# Patient Record
Sex: Male | Born: 1995 | Race: White | Hispanic: No | Marital: Single | Smoking: Current every day smoker
Health system: Southern US, Community
[De-identification: ages and names within clinical notes are randomized; demographics above are authoritative.]

## PROBLEM LIST (undated history)

## (undated) DIAGNOSIS — R030 Elevated blood-pressure reading, without diagnosis of hypertension: Secondary | ICD-10-CM

## (undated) DIAGNOSIS — L858 Other specified epidermal thickening: Secondary | ICD-10-CM

## (undated) HISTORY — DX: Elevated blood-pressure reading, without diagnosis of hypertension: R03.0

## (undated) HISTORY — DX: Other specified epidermal thickening: L85.8

---
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
## 2015-01-12 ENCOUNTER — Encounter: Payer: Self-pay | Admitting: Family Medicine

## 2015-01-12 ENCOUNTER — Ambulatory Visit (INDEPENDENT_AMBULATORY_CARE_PROVIDER_SITE_OTHER): Payer: BLUE CROSS/BLUE SHIELD | Admitting: Family Medicine

## 2015-01-12 VITALS — BP 130/74 | HR 89 | Wt 201.0 lb

## 2015-01-12 DIAGNOSIS — F411 Generalized anxiety disorder: Secondary | ICD-10-CM | POA: Diagnosis not present

## 2015-01-12 DIAGNOSIS — F3160 Bipolar disorder, current episode mixed, unspecified: Secondary | ICD-10-CM

## 2015-01-12 MED ORDER — LAMOTRIGINE 100 MG PO TABS: ORAL_TABLET | ORAL | Status: DC

## 2015-01-12 MED ORDER — CLONAZEPAM 0.5 MG PO TABS: 0.2500 mg | ORAL_TABLET | Freq: Two times a day (BID) | ORAL | Status: DC | PRN

## 2015-01-12 MED ORDER — ARIPIPRAZOLE 15 MG PO TABS: 15.0000 mg | ORAL_TABLET | Freq: Every day | ORAL | Status: DC

## 2015-01-12 NOTE — Progress Notes (Signed)
CC: Wesley Gomez is a 19 y.o. male is here for f/u meds   Subjective: HPI:  Follow-up anxiety: He's been using hydroxyzine to address his daily anxiety. It's not been helpful whatsoever. He denies any known side effects from it. He still has daily anxiety described as worrying about "all sorts of things", it sounds like he gets anxious and overanalyzes everything and anything. He denies any particular reoccurring fear or nervousness. Symptoms can occur anytime of the day and are moderate in severity. He has not found anything that helps this. He denies any paranoia or hallucinations.  Follow bipolar disorder: He states that he still believes that Abilify and Lamictal are doing a great job with controlling his depression. He tells me he's happy with life other than the annoyance of daily anxiety. He denies thoughts 1 to harm self or others. No unintentional weight gain or loss. Denies any known side effects.   Review Of Systems Outlined In HPI  Past Medical History  Diagnosis Date  . 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 Elevated BP 08/19/2012    No past surgical history on file. No family history on file.  Social History   Social History  . Marital Status: Single    Spouse Name: N/A  . Number of Children: N/A  . Years of Education: N/A   Occupational History  . Not on file.   Social History Main Topics  . Smoking status: Never Smoker   . Smokeless tobacco: Not on file  . Alcohol Use: No  . Drug Use: No  . Sexual Activity: Not on file   Other Topics Concern  . Not on file   Social History Narrative     Objective: BP 130/74 mmHg  Pulse 89  Wt 201 lb (91.173 kg)  Vital signs reviewed. General: Alert and Oriented, No Acute Distress HEENT: Pupils equal, round, reactive to light. Conjunctivae clear.  External ears unremarkable.  Moist mucous membranes. Lungs: Clear and comfortable work of breathing, speaking in full sentences without accessory muscle use. Cardiac: Regular rate and  rhythm.  Neuro: CN II-XII grossly intact, gait normal. Extremities: No peripheral edema.  Strong peripheral pulses.  Mental Status: No depression, anxiety, nor agitation. Logical though process. Skin: Warm and dry.  Assessment & Plan: Adoni was seen today for f/u meds.  Diagnoses and all orders for this visit:  Bipolar affective disorder, most recent episode mixed, remission status unspecified -     lamoTRIgine (LAMICTAL) 100 MG tablet; One by mouth daily.  Anxiety state -     clonazePAM (KLONOPIN) 0.5 MG tablet; Take 0.5-1 tablets (0.25-0.5 mg total) by mouth 2 (two) times daily as needed for anxiety.  Generalized anxiety disorder  Other orders -     ARIPiprazole (ABILIFY) 15 MG tablet; Take 1 tablet (15 mg total) by mouth daily.   Anxiety: Uncontrolled chronic condition start as needed clonazepam, discuss its best to use this only if absolutely necessary to help prevent tolerance Bipolar disorder: Controlled continue Abilify and Lamictal.   Return in about 3 months (around 04/14/2015).

## 2015-01-18 ENCOUNTER — Ambulatory Visit (INDEPENDENT_AMBULATORY_CARE_PROVIDER_SITE_OTHER): Payer: BLUE CROSS/BLUE SHIELD | Admitting: Family Medicine

## 2015-01-18 ENCOUNTER — Encounter: Payer: Self-pay | Admitting: Family Medicine

## 2015-01-18 VITALS — BP 132/80 | HR 93 | Wt 192.0 lb

## 2015-01-18 DIAGNOSIS — F3160 Bipolar disorder, current episode mixed, unspecified: Secondary | ICD-10-CM | POA: Diagnosis not present

## 2015-01-18 DIAGNOSIS — F411 Generalized anxiety disorder: Secondary | ICD-10-CM | POA: Diagnosis not present

## 2015-01-18 MED ORDER — ALPRAZOLAM 1 MG PO TABS: 1.0000 mg | ORAL_TABLET | Freq: Every day | ORAL | Status: DC | PRN

## 2015-01-18 MED ORDER — ARIPIPRAZOLE 30 MG PO TABS: 30.0000 mg | ORAL_TABLET | Freq: Every day | ORAL | Status: DC

## 2015-01-18 NOTE — Progress Notes (Signed)
CC: Wesley Gomez is a 19 y.o. male is here for clonazepam not helping   Subjective: HPI:   Follow-up anxiety: He tells me he could barely even feel that he was taking any medication when he was taking as needed clonazepam. It did not seem to help with anxiety whatsoever. He continues to have a moderate degree of daily worrying about anything and everything. He denies any paranoia or suspicions. Symptoms are present all hours of day and occasionally interfere with sleep.  He feels that his depression might be starting to get worse. He's losing interest in formerly pleasurable activities. He is afraid that he might be slipping back into depression but denies any other specific symptoms. Denies unintentional weight loss nor weight gain. Denies appetite change. Denies fevers, chills nor any feeling of being unwell other than that described above.   Review Of Systems Outlined In HPI  Past Medical History  Diagnosis Date  . 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 Elevated BP 08/19/2012    No past surgical history on file. No family history on file.  Social History   Social History  . Marital Status: Single    Spouse Name: N/A  . Number of Children: N/A  . Years of Education: N/A   Occupational History  . Not on file.   Social History Main Topics  . Smoking status: Never Smoker   . Smokeless tobacco: Not on file  . Alcohol Use: No  . Drug Use: No  . Sexual Activity: Not on file   Other Topics Concern  . Not on file   Social History Narrative     Objective: BP 132/80 mmHg  Pulse 93  Wt 192 lb (87.091 kg)  Vital signs reviewed. General: Alert and Oriented, No Acute Distress HEENT: Pupils equal, round, reactive to light. Conjunctivae clear.  External ears unremarkable.  Moist mucous membranes. Lungs: Clear and comfortable work of breathing, speaking in full sentences without accessory muscle use. Cardiac: Regular rate and rhythm.  Neuro: CN II-XII grossly intact, gait  normal. Extremities: No peripheral edema.  Strong peripheral pulses.  Mental Status: No depression, anxiety, nor agitation. Logical though process. Skin: Warm and dry.  Assessment & Plan: Wesley Gomez was seen today for clonazepam not helping.  Diagnoses and all orders for this visit:  Anxiety state -     ARIPiprazole (ABILIFY) 30 MG tablet; Take 1 tablet (30 mg total) by mouth daily. -     ALPRAZolam (XANAX) 1 MG tablet; Take 1 tablet (1 mg total) by mouth daily as needed for anxiety.  Bipolar affective disorder, most recent episode mixed, remission status unspecified   Anxiety: Uncontrolled, he is already experiencing some decreased libido due to his anxiety therefore joint decision to avoid Lexapro at this time and instead start Xanax. Bipolar disorder: Possibly worsening therefore increasing Abilify.  25 minutes spent face-to-face during visit today of which at least 50% was counseling or coordinating care regarding: 1. Anxiety state   2. Bipolar affective disorder, most recent episode mixed, remission status unspecified      Return in about 3 months (around 04/20/2015).

## 2015-01-25 ENCOUNTER — Encounter: Payer: Self-pay | Admitting: Family Medicine

## 2015-01-25 ENCOUNTER — Ambulatory Visit (INDEPENDENT_AMBULATORY_CARE_PROVIDER_SITE_OTHER): Payer: BLUE CROSS/BLUE SHIELD | Admitting: Family Medicine

## 2015-01-25 VITALS — BP 135/83 | HR 98 | Wt 196.0 lb

## 2015-01-25 DIAGNOSIS — R41840 Attention and concentration deficit: Secondary | ICD-10-CM

## 2015-01-25 DIAGNOSIS — F3160 Bipolar disorder, current episode mixed, unspecified: Secondary | ICD-10-CM

## 2015-01-25 MED ORDER — ARIPIPRAZOLE 15 MG PO TABS: 15.0000 mg | ORAL_TABLET | Freq: Every day | ORAL | Status: DC

## 2015-01-25 MED ORDER — LAMOTRIGINE 200 MG PO TABS: 200.0000 mg | ORAL_TABLET | Freq: Every day | ORAL | Status: DC

## 2015-01-25 NOTE — Progress Notes (Signed)
CC: Wesley Gomez is a 19 y.o. male is here for referral for ADHD and Personal Problem   Subjective: HPI:  Follow-up bipolar: Since going up on Abilify he's noticed it is much more restless and fidgety. He doesn't feel that depression has gotten a bit better since he went up on this medication. He's done some research online and says that he expresses symptoms of inattentiveness, distractibility, restlessness, and forgetfulness to a severe degree that was even present prior to going on Abilify or Lamictal. He wants to know if she might have ADD. No thoughts of wanting to harm self or others.  Anxiety seems to not use has been issue to him since I saw him last.   Review Of Systems Outlined In HPI  Past Medical History  Diagnosis Date  . 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 Elevated BP 08/19/2012    No past surgical history on file. No family history on file.  Social History   Social History  . Marital Status: Single    Spouse Name: N/A  . Number of Children: N/A  . Years of Education: N/A   Occupational History  . Not on file.   Social History Main Topics  . Smoking status: Never Smoker   . Smokeless tobacco: Not on file  . Alcohol Use: No  . Drug Use: No  . Sexual Activity: Not on file   Other Topics Concern  . Not on file   Social History Narrative     Objective: BP 135/83 mmHg  Pulse 98  Wt 196 lb (88.905 kg)  Vital signs reviewed. General: Alert and Oriented, No Acute Distress HEENT: Pupils equal, round, reactive to light. Conjunctivae clear.  External ears unremarkable.  Moist mucous membranes. Lungs: Clear and comfortable work of breathing, speaking in full sentences without accessory muscle use. Cardiac: Regular rate and rhythm.  Neuro: CN II-XII grossly intact, gait normal. Extremities: No peripheral edema.  Strong peripheral pulses.  Mental Status: No depression, anxiety, nor agitation. Logical though process. Skin: Warm and dry. Assessment & Plan: [place] was  seen today for referral for adhd and personal problem.  Diagnoses and all orders for this visit:  Poor concentration -     Ambulatory referral to Psychology  Bipolar affective disorder, most recent episode mixed, remission status unspecified  Other orders -     ARIPiprazole (ABILIFY) 15 MG tablet; Take 1 tablet (15 mg total) by mouth daily. -     lamoTRIgine (LAMICTAL) 200 MG tablet; Take 1 tablet (200 mg total) by mouth daily.   Bipolar disorder: Uncontrolled chronic condition decreasing Abilify due to side effects, now increasing Lamictal to 200 mg. Poor concentration: There's a good likelihood that he did have comorbid ADHD along with bipolar disorder. Referral to Dr. Johnnye Sima for ADHD testing.  Return if symptoms worsen or fail to improve.

## 2015-02-20 ENCOUNTER — Other Ambulatory Visit: Payer: Self-pay | Admitting: Family Medicine

## 2015-02-23 ENCOUNTER — Other Ambulatory Visit: Payer: Self-pay | Admitting: Family Medicine

## 2015-02-27 ENCOUNTER — Other Ambulatory Visit: Payer: Self-pay | Admitting: Family Medicine

## 2015-03-08 ENCOUNTER — Ambulatory Visit (INDEPENDENT_AMBULATORY_CARE_PROVIDER_SITE_OTHER): Payer: BLUE CROSS/BLUE SHIELD | Admitting: Sports Medicine

## 2015-03-08 ENCOUNTER — Encounter: Payer: Self-pay | Admitting: Sports Medicine

## 2015-03-08 DIAGNOSIS — F3132 Bipolar disorder, current episode depressed, moderate: Secondary | ICD-10-CM

## 2015-03-08 MED ORDER — ARIPIPRAZOLE 20 MG PO TABS: 20.0000 mg | ORAL_TABLET | Freq: Every day | ORAL | Status: DC

## 2015-03-08 NOTE — Assessment & Plan Note (Addendum)
Unfortunately in depressive phase right now. Previously bumping up from 15-30 mg of Abilify has created akathisia. Going to simply increase to 20 mg of Abilify, and continue Lamictal at the current dose. I'm also trying get him into psychiatry downstairs. Return in one to see PCP I was able to get him in on Friday with psychiatry downstairs to establish, phew.

## 2015-03-08 NOTE — Progress Notes (Signed)
  Subjective:    CC: follow-up  HPI: Bipolar disorder, depressed phase: Wesley Gomez is a pleasant 19 year old male with bipolar disorder, depressive phase, he has been well-controlled on 200 mg of Lamictal and 15 mg of Abilify, and a previous depressed phase he was increased to 30 mg of Abilify which seemed simply to cause increasing akathisia. Has not yet seen his psychiatrist. He does see a behavioral therapist. Currently he is feeling symptoms of depression with moderate anhedonia, difficulty sleeping, poor energy, changes in appetite, guilt, and mild depressed mood, difficult to concentrating, psychomotor retardation, he denies any suicidal or homicidal ideation. He tells me his last manic phase was several weeks ago, he is able to recognize it, and he felt decreased need for sleep, and a somewhat elated mood.  Past medical history, Surgical history, Family history not pertinant except as noted below, Social history, Allergies, and medications have been entered into the medical record, reviewed, and no changes needed.   Review of Systems: No fevers, chills, night sweats, weight loss, chest pain, or shortness of breath.   Objective:    General: Well Developed, well nourished, and in no acute distress.  Neuro: Alert and oriented x3, extra-ocular muscles intact, sensation grossly intact.  HEENT: Normocephalic, atraumatic, pupils equal round reactive to light, neck supple, no masses, no lymphadenopathy, thyroid nonpalpable.  Skin: Warm and dry, no rashes. Cardiac: Regular rate and rhythm, no murmurs rubs or gallops, no lower extremity edema.  Respiratory: Clear to auscultation bilaterally. Not using accessory muscles, speaking in full sentences.  Impression and Recommendations:   I spent 25 minutes with this patient, greater than 50% was face-to-face time counseling regarding the above diagnoses

## 2015-03-12 ENCOUNTER — Ambulatory Visit (INDEPENDENT_AMBULATORY_CARE_PROVIDER_SITE_OTHER): Payer: Commercial Indemnity | Admitting: Psychiatry

## 2015-03-12 ENCOUNTER — Encounter (HOSPITAL_COMMUNITY): Payer: Self-pay | Admitting: Psychiatry

## 2015-03-12 VITALS — HR 82 | Ht 69.0 in | Wt 195.0 lb

## 2015-03-12 DIAGNOSIS — F3130 Bipolar disorder, current episode depressed, mild or moderate severity, unspecified: Secondary | ICD-10-CM

## 2015-03-12 DIAGNOSIS — F411 Generalized anxiety disorder: Secondary | ICD-10-CM

## 2015-03-12 DIAGNOSIS — F1210 Cannabis abuse, uncomplicated: Secondary | ICD-10-CM | POA: Diagnosis not present

## 2015-03-12 DIAGNOSIS — F3132 Bipolar disorder, current episode depressed, moderate: Secondary | ICD-10-CM

## 2015-03-12 DIAGNOSIS — F1290 Cannabis use, unspecified, uncomplicated: Secondary | ICD-10-CM

## 2015-03-12 MED ORDER — ARIPIPRAZOLE 15 MG PO TABS: 15.0000 mg | ORAL_TABLET | Freq: Every day | ORAL | Status: DC

## 2015-03-12 MED ORDER — BUPROPION HCL 100 MG PO TABS: 100.0000 mg | ORAL_TABLET | Freq: Two times a day (BID) | ORAL | Status: DC

## 2015-03-12 NOTE — Progress Notes (Signed)
Psychiatric Initial Adult Assessment   Patient Identification: Wesley Gomez MRN:  [id number] Date of Evaluation:  03/12/2015 Referral Source: Dr. Ileene Gomez and Dr. Darene Gomez Chief Complaint:   [name]; Depression     Visit Diagnosis:    ICD-9-CM ICD-10-CM   1. Bipolar I disorder, most recent episode depressed ([place]) 296.50 F31.30   2. GAD (generalized anxiety disorder) 300.02 F41.1   3. Bipolar affective disorder, currently depressed, moderate ([facility]) 296.52 F31.32 ARIPiprazole (ABILIFY) 15 MG tablet  4. Marijuana use 305.20 F12.10    Diagnosis:   Patient Active Problem List   Diagnosis Date Noted  . Generalized anxiety disorder [F41.1] 01/12/2015  . Bipolar disorder ([place]) [F31.9] 11/16/2014  . 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 Elevated BP [R03.0] 08/19/2012  . Acne [L70.9] 08/19/2012   History of Present Illness:  19 years old currently single [place] was living with his parents. Referred by his primary care Dr. Ileene Gomez and Dr. Darene Gomez. has been diagnosed with bipolar currently on Abilify and Lamictal. Because of EPS is Abilify was cut down to 20 mg prior to referral. He has noticed decrease in the tremors since cut down of the dose.  Patient gives a history of having episodes of depression and episodes of hyper less. Elevated mood episodes include elevated mood with increased energy and distractibility with decreased need for sleep getting involved in spending money. Says that they last maybe a week or more. He has been on Lamictal and Abilify by his primary care physician have never seen a psychiatrist before have seen a counselor in the past. Patient also endorses episodes of depression which it includes decreased energy, anhedonia, decreased interest decreased motivation tiredness. Feeling of helplessness or down mood but not hopelessness or suicidal thoughts. Patient also endorses using marijuana for the last 2 years once a week or often more than that. Last  use was 2 weeks ago according to him.  Patient left school in ninth grade city was anxious in school is suffered from anxiety was not able to cope with school, anxiety and stress.  Patient also endorses excessive worries, unreasonable at times. It affects his sleep and also sometimes has panic attacks. He is on Xanax 1 mg a day. Says that this has started for the last 2 months he understands that it would be for short term.  Aggravating factors; there is no clear aggravating factor for depression. Says that he does get stressed out her anxiety may be triggered depression Modifying factors; his girlfriend, supportive parents, music   Elements:  Location:  depression, anxiety. Quality:  moderate. Severity:  5/10. . Associated Signs/Symptoms: Depression Symptoms:  anhedonia, difficulty concentrating, anxiety, loss of energy/fatigue, disturbed sleep, (Hypo) Manic Symptoms:  Distractibility, Labiality of Mood, Anxiety Symptoms:  Excessive Worry, Psychotic Symptoms:  denies PTSD Symptoms: NA  Past Medical History:  Past Medical History  Diagnosis Date  . 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 Elevated BP 08/19/2012   History reviewed. No pertinent past surgical history. Family History:  Family History  Problem Relation Age of Onset  . Depression Father   . Depression Maternal Grandmother    Social History:   Social History   Social History  . Marital Status: Single    Spouse Name: N/A  . Number of Children: N/A  . Years of Education: N/A   Social History Main Topics  . Smoking status: Never Smoker   . Smokeless tobacco: None  . Alcohol Use: No  . Drug Use: No  .  Sexual Activity: Yes   Other Topics Concern  . None   Social History Narrative   Additional Social History: Patient grew up with his parents. And has one brother and one sister. He left school in ninth grade says that he was having anxiety in school and did not want to continue. He is transferred to finish his GED.  Currently patient is not working. He is living with his parents   Musculoskeletal: Strength & Muscle Tone: within normal limits Gait & Station: normal Patient leans: Backward  Psychiatric Specialty Exam: HPI  Review of Systems  Constitutional: Negative.   Cardiovascular: Negative for chest pain.  Skin: Negative for rash.  Neurological: Negative for headaches.  Psychiatric/Behavioral: Positive for depression. Negative for suicidal ideas and hallucinations.    Pulse 82, height 5\' 9"  (1.753 m), weight 195 lb (88.451 kg).Body mass index is 28.78 kg/(m^2).  General Appearance: Casual  Eye Contact:  Fair  Speech:  Slow  Volume:  Decreased  Mood:  Dysphoric  Affect:  Constricted  Thought Process:  Coherent  Orientation:  Full (Time, Place, and Person)  Thought Content:  Rumination  Suicidal Thoughts:  No  Homicidal Thoughts:  No  Memory:  Immediate;   Fair Recent;   Fair  Judgement:  Fair  Insight:  Shallow  Psychomotor Activity:  Decreased  Concentration:  Fair  Recall:  [employer] of Knowledge:Fair  Language: Fair  Akathisia:  mild  Handed:  Right  AIMS (if indicated):  0  Assets:  Desire for Improvement Physical Health Social Support  ADL's:  Intact  Cognition: WNL  Sleep:  fair   Is the patient at risk to self?  No. Has the patient been a risk to self in the past 6 months?  No. Has the patient been a risk to self within the distant past?  No. Is the patient a risk to others?  No. Has the patient been a risk to others in the past 6 months?  No. Has the patient been a risk to others within the distant past?  No.  Allergies:  No Known Allergies Current Medications: Current Outpatient Prescriptions  Medication Sig Dispense Refill  . Adapalene-Benzoyl Peroxide (EPIDUO) 0.1-2.5 % gel Apply 1 application topically at bedtime. Patient needs to schedule a follow up appointment before more refills. 45 g 0  . ALPRAZolam (XANAX) 1 MG tablet TAKE 1 TABLET BY MOUTH EVERY  DAY 30 tablet 0  . ARIPiprazole (ABILIFY) 15 MG tablet Take 1 tablet (15 mg total) by mouth daily. Patient has 30mg  tablets . Will take half of it. 30 tablet 0  . buPROPion (WELLBUTRIN) 100 MG tablet Take 1 tablet (100 mg total) by mouth 2 (two) times daily. 30 tablet 0  . lamoTRIgine (LAMICTAL) 200 MG tablet Take 1 tablet (200 mg total) by mouth daily. 30 tablet 2   No current facility-administered medications for this visit.    Previous Psychotropic Medications: No   Substance Abuse History in the last 12 months:  Yes.    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 Says it helps his anxiety,  Medical Decision Making:  Review of Psycho-Social Stressors (1), Decision to obtain old records (1), Order AIMS Test (2), Review of Medication Regimen & Side Effects (2) and Review of New Medication or Change in Dosage (2)  Treatment Plan Summary: Medication management and Plan as follows  Bipolar disorder,  depressed: He will continue Lamictal. There is no rash Lower Abilify to 15 mg he does have a 30 minute will start using half of it. This is to cut down further on his tremors. Add Wellbutrin 100 mg for depression and keep the dose low since he has bipolar. Reviewed side effects GAD: He is currently on Xanax 1 mg a day cautioned not to use it for long-term treatment discussed option of changing it to BuSpar or an SSRI . He gets refill from Dr. Ileene Gomez. Marijuana use: Cautioned that to abstain it and it can affect his judgment and cause more depression he remains reluctant and shows poor insight about its effect on his condition. Labs reviewed and are done by primary care Recommended psychotherapy to evaluate further on anxiety and depression but he does not give a clear trigger factor for depression. Patient is a non nicotine smoker More than 50% spent in counseling and coordination care  including patient education Call 911 or report local emergency room for any urgent concerns of suicidal thoughts Follow-up in 3-4 weeks or earlier if needed    Alim Cattell 10/14/201612:04 PM

## 2015-03-12 NOTE — Patient Instructions (Signed)
Cut down dose of abilify to 15mg . He has 30mg  tablets . Will use half tablets Start wellbutrin 100mg  Abstain from [facility]

## 2015-03-15 ENCOUNTER — Ambulatory Visit (INDEPENDENT_AMBULATORY_CARE_PROVIDER_SITE_OTHER): Payer: BLUE CROSS/BLUE SHIELD | Admitting: Family Medicine

## 2015-03-15 ENCOUNTER — Encounter: Payer: Self-pay | Admitting: Family Medicine

## 2015-03-15 VITALS — BP 133/80 | HR 90 | Wt 199.0 lb

## 2015-03-15 DIAGNOSIS — F3132 Bipolar disorder, current episode depressed, moderate: Secondary | ICD-10-CM

## 2015-03-15 MED ORDER — LAMOTRIGINE 200 MG PO TABS: 200.0000 mg | ORAL_TABLET | Freq: Every day | ORAL | Status: DC

## 2015-03-15 NOTE — Progress Notes (Signed)
CC: Wesley Gomez is a 19 y.o. male is here for Depression   Subjective: HPI:   follow-up bipolar disorder: Since I saw him last he is started on Wellbutrin. He tells me that he feels like it's working with respect to depressive symptoms. He denies any known side effects from this medication. Since Abilify was decreased he's no longer had any shaking of the hands. He denies any trauma elsewhere. He tells me he is a little bit anxious about nothing in particular and it's mildly interfering with quality of life. He is not certain if it's gotten better or worse and started on Wellbutrin. He denies any thoughts of wanting to harm himself or others.  He denies any manic episodes or difficulty sleeping.   Review Of Systems Outlined In HPI  Past Medical History  Diagnosis Date  . 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 Elevated BP 08/19/2012    No past surgical history on file. Family History  Problem Relation Age of Onset  . Depression Father   . Depression Maternal Grandmother     Social History   Social History  . Marital Status: Single    Spouse Name: N/A  . Number of Children: N/A  . Years of Education: N/A   Occupational History  . Not on file.   Social History Main Topics  . Smoking status: Never Smoker   . Smokeless tobacco: Not on file  . Alcohol Use: No  . Drug Use: No  . Sexual Activity: Yes   Other Topics Concern  . Not on file   Social History Narrative     Objective: BP 133/80 mmHg  Pulse 90  Wt 199 lb (90.266 kg)  Vital signs reviewed. General: Alert and Oriented, No Acute Distress HEENT: Pupils equal, round, reactive to light. Conjunctivae clear.  External ears unremarkable.  Moist mucous membranes. Lungs: Clear and comfortable work of breathing, speaking in full sentences without accessory muscle use. Cardiac: Regular rate and rhythm.  Neuro: CN II-XII grossly intact, gait normal. Extremities: No peripheral edema.  Strong peripheral pulses.  Mental Status: No  depression, anxiety, nor agitation. Logical though process. Skin: Warm and dry.  Assessment & Plan: Wesley Gomez was seen today for depression.  Diagnoses and all orders for this visit:  Bipolar affective disorder, currently depressed, moderate ([place])  Other orders -     lamoTRIgine (LAMICTAL) 200 MG tablet; Take 1 tablet (200 mg total) by mouth daily.   Bipolar disorder: Currently controlled with Lamictal, Abilify, Wellbutrin. Recommended that he follow-up with psychiatry in the middle of November. Discussed that if anxiety does not improve by the beginning of next week please notify me and we'll offer alternatives to Xanax but will continue bupropion.  Return if symptoms worsen or fail to improve.

## 2015-04-02 ENCOUNTER — Encounter (HOSPITAL_COMMUNITY): Payer: Self-pay | Admitting: Psychiatry

## 2015-04-02 ENCOUNTER — Ambulatory Visit (INDEPENDENT_AMBULATORY_CARE_PROVIDER_SITE_OTHER): Payer: Commercial Indemnity | Admitting: Psychiatry

## 2015-04-02 ENCOUNTER — Ambulatory Visit: Payer: BLUE CROSS/BLUE SHIELD | Admitting: Family Medicine

## 2015-04-02 VITALS — BP 128/70 | HR 98 | Ht 70.0 in | Wt 205.0 lb

## 2015-04-02 DIAGNOSIS — F3132 Bipolar disorder, current episode depressed, moderate: Secondary | ICD-10-CM

## 2015-04-02 MED ORDER — BUPROPION HCL 100 MG PO TABS: 200.0000 mg | ORAL_TABLET | Freq: Two times a day (BID) | ORAL | Status: DC

## 2015-04-02 MED ORDER — ARIPIPRAZOLE 10 MG PO TABS: 10.0000 mg | ORAL_TABLET | Freq: Every day | ORAL | Status: DC

## 2015-04-02 NOTE — Progress Notes (Signed)
Patient ID: Wesley Gomez, male   DOB: 1995/06/01, 19 y.o.   MRN: [id number]  Psychiatric Outpatient Follow up visit  Patient Identification: Wesley Gomez MRN:  [id number] Date of Evaluation:  04/02/2015 Referral Source: Dr. Ileene Rubens and Dr. Darene Lamer Chief Complaint:   Chief Complaint    Follow-up     Visit Diagnosis:    ICD-9-CM ICD-10-CM   1. Bipolar affective disorder, currently depressed, moderate (HCC) 296.52 F31.32 ARIPiprazole (ABILIFY) 10 MG tablet   Diagnosis:   Patient Active Problem List   Diagnosis Date Noted  . Generalized anxiety disorder [F41.1] 01/12/2015  . Bipolar disorder ([place]) [F31.9] 11/16/2014  . 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 Elevated BP [R03.0] 08/19/2012  . Acne [L70.9] 08/19/2012   History of Present Illness:  19 years old currently single white male living with his parents. Referred initially by his primary care Dr. Ileene Rubens and Dr. Darene Lamer. has been diagnosed with bipolar currently on Abilify and Lamictal. Because of EPS is Abilify was cut down to 20 mg prior to referral. He has noticed decrease in the tremors since cut down of the dose.  Last visit to cut down Abilify from 20-15 mg. There is some decrease in tremors. He still remains somewhat down Wellbutrin was added but he did not increase to 200 mg. States that he struggled for a job he does have the work permit and that'll help once he starts working. There is no worsening of depression since he cut down the Abilify  Patient left school in ninth grade city was anxious in school is suffered from anxiety was not able to cope with school, anxiety and stress.  Patient still endorses anxiety excessive worries at times. He is not taking Xanax anymore. He continues to chemical formal symptoms of dose of 200 mg is no rash reported  Aggravating factors; there is no clear aggravating factor for depression. Says that he does get stressed out her anxiety may be triggered depression Modifying factors; his girlfriend,  supportive parents, music   Elements:   Location: depression, anxiety, tremors severity: 6/10. 10 being no depression Quality: mild to moderate.  Associated Signs/Symptoms:  (Hypo) Manic Symptoms:  distractability Anxiety Symptoms:  Excessive Worry, Psychotic Symptoms:  denies PTSD Symptoms: NA  Past Medical History:  Past Medical History  Diagnosis Date  . 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 Elevated BP 08/19/2012   History reviewed. No pertinent past surgical history. Family History:  Family History  Problem Relation Age of Onset  . Depression Father   . Depression Maternal Grandmother    Social History:   Social History   Social History  . Marital Status: Single    Spouse Name: N/A  . Number of Children: N/A  . Years of Education: N/A   Social History Main Topics  . Smoking status: Never Smoker   . Smokeless tobacco: None  . Alcohol Use: No  . Drug Use: No  . Sexual Activity: Yes   Other Topics Concern  . None   Social History Narrative      Musculoskeletal: Strength & Muscle Tone: within normal limits Gait & Station: normal Patient leans: Backward  Psychiatric Specialty Exam: HPI  Review of Systems  Constitutional: Negative.   Cardiovascular: Negative for chest pain.  Skin: Negative for rash.  Neurological: Positive for tremors. Negative for headaches.  Psychiatric/Behavioral: Positive for depression. Negative for suicidal ideas and hallucinations.    Blood pressure 128/70, pulse 98, height 5\' 10"  (1.778 m), weight 205 lb (92.987 kg), SpO2 98 %.  Body mass index is 29.41 kg/(m^2).  General Appearance: Casual  Eye Contact:  Fair  Speech:  Slow  Volume:  Decreased  Mood:  Dysphoric but less  Affect:  Constricted  Thought Process:  Coherent  Orientation:  Full (Time, Place, and Person)  Thought Content:  Rumination  Suicidal Thoughts:  No  Homicidal Thoughts:  No  Memory:  Immediate;   Fair Recent;   Fair  Judgement:  Fair  Insight:  Shallow   Psychomotor Activity:  Decreased  Concentration:  Fair  Recall:  [employer] of Knowledge:Fair  Language: Fair  Akathisia:  mild  Handed:  Right  AIMS (if indicated):  0  Assets:  Desire for Improvement Physical Health Social Support  ADL's:  Intact  Cognition: WNL  Sleep:  fair   Is the patient at risk to self?  No. Has the patient been a risk to self in the past 6 months?  No. Has the patient been a risk to self within the distant past?  No. Is the patient a risk to others?  No. Has the patient been a risk to others in the past 6 months?  No. Has the patient been a risk to others within the distant past?  No.  Allergies:  No Known Allergies Current Medications: Current Outpatient Prescriptions  Medication Sig Dispense Refill  . Adapalene-Benzoyl Peroxide (EPIDUO) 0.1-2.5 % gel Apply 1 application topically at bedtime. 19 years old currently single white male living with his parents. Referred initially by his primary care Dr. Ileene Rubens and Dr. Darene Lamer. has been diagnosed with bipolar currently on Abilify and Lamictal. Because of EPS is Abilify was cut down to 20 mg prior to referral. He has noticed decrease in the tremors since cut down of the dose.  Last visit to cut down Abilify from 20-15 mg. There is some decrease in tremors. He still remains somewhat down Wellbutrin was added but he did not increase to 200 mg. States that he struggled for a job he does have the work permit and that'll help once he starts working. There is no worsening of depression since he cut down the Abilify  Patient left school in ninth grade city was anxious in school is suffered from anxiety was not able to cope with school, anxiety and stress.  Patient still endorses anxiety excessive worries at times. He is not taking Xanax anymore. He continues to chemical formal symptoms of dose of 200 mg is no rash reported  Aggravating factors; there is no clear aggravating factor for depression. Says that he does get stressed out her anxiety may be triggered depression Modifying factors; his girlfriend,  supportive parents, music   Elements:   Location: depression, anxiety, tremors severity: 6/10. 10 being no depression Quality: mild to moderate.  Associated Signs/Symptoms:  (Hypo) Manic Symptoms:  distractability Anxiety Symptoms:  Excessive Worry, Psychotic Symptoms:  denies PTSD Symptoms: NA  Past Medical History:  Past Medical History  Diagnosis Date  . 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 Elevated BP 08/19/2012   History reviewed. No pertinent past surgical history. Family History:  Family History  Problem Relation Age of Onset  . Depression Father   . Depression Maternal Grandmother    Social History:   Social History   Social History  . Marital Status: Single    Spouse Name: N/A  . Number of Children: N/A  . Years of Education: N/A   Social History Main Topics  . Smoking status: Never Smoker   . Smokeless tobacco: None  . Alcohol Use: No  . Drug Use: No  . Sexual Activity: Yes   Other Topics Concern  . None   Social History Narrative      Musculoskeletal: Strength & Muscle Tone: within normal limits Gait & Station: normal Patient leans: Backward  Psychiatric Specialty Exam: HPI  Review of Systems  Constitutional: Negative.   Cardiovascular: Negative for chest pain.  Skin: Negative for rash.  Neurological: Positive for tremors. Negative for headaches.  Psychiatric/Behavioral: Positive for depression. Negative for suicidal ideas and hallucinations.    Blood pressure 128/70, pulse 98, height 5' 10" (1.778 m), weight 205 lb (92.987 kg), SpO2 98 %.  Body mass index is 29.41 kg/(m^2).  General Appearance: Casual  Eye Contact:  Fair  Speech:  Slow  Volume:  Decreased  Mood:  Dysphoric but less  Affect:  Constricted  Thought Process:  Coherent  Orientation:  Full (Time, Place, and Person)  Thought Content:  Rumination  Suicidal Thoughts:  No  Homicidal Thoughts:  No  Memory:  Immediate;   Fair Recent;   Fair  Judgement:  Fair  Insight:  Shallow   Psychomotor Activity:  Decreased  Concentration:  Fair  Recall:  [employer] of Knowledge:Fair  Language: Fair  Akathisia:  mild  Handed:  Right  AIMS (if indicated):  0  Assets:  Desire for Improvement Physical Health Social Support  ADL's:  Intact  Cognition: WNL  Sleep:  fair   Is the patient at risk to self?  No. Has the patient been a risk to self in the past 6 months?  No. Has the patient been a risk to self within the distant past?  No. Is the patient a risk to others?  No. Has the patient been a risk to others in the past 6 months?  No. Has the patient been a risk to others within the distant past?  No. Allergies:  No Known Allergies Current Medications: Current Outpatient Prescriptions  Medication Sig Dispense Refill  . Adapalene-Benzoyl Peroxide (EPIDUO) 0.1-2.5 % gel Apply 1 application topically at bedtime. Patient needs to schedule a follow up appointment before more refills. 45 g 0  . ARIPiprazole (ABILIFY) 10 MG tablet Take 1 tablet (10 mg total) by mouth daily. Lower tablet to 10mg  qd.    . buPROPion (WELLBUTRIN) 100 MG tablet Take 2 tablets (200 mg total) by mouth 2 (two) times daily. 60 tablet 0  . lamoTRIgine (LAMICTAL) 200 MG tablet Take 1 tablet (200 mg total) by mouth daily. 30 tablet 2  . ALPRAZolam (XANAX) 1 MG tablet TAKE 1 TABLET BY MOUTH EVERY DAY (Patient not taking: Reported on 04/02/2015) 30 tablet 0   No current facility-administered medications for this visit.    Previous Psychotropic Medications: No   Substance Abuse History in the last 12 months:  Yes.    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 Says it helps his anxiety,  Medical Decision Making:  Review of Psycho-Social Stressors (1), Decision to obtain old records (1), Order AIMS Test (2), Review of Medication Regimen & Side Effects (2) and Review of New Medication or Change in  Dosage (2)  Treatment Plan Summary: Medication management and Plan as follows  Bipolar disorder, depressed: He will continue Lamictal. There is no rash Lower Abilify to 10 mg . This is to cut down further on his tremors.\  Inc 45 g 0  . ARIPiprazole (ABILIFY) 10 MG tablet Take 1 tablet (10 mg total) by mouth daily. Lower tablet to 10mg  qd.    . buPROPion (WELLBUTRIN) 100 MG tablet Take 2 tablets (200 mg total) by mouth 2 (two) times daily. 60 tablet 0  . lamoTRIgine (LAMICTAL) 200 MG tablet Take 1 tablet (200 mg total) by mouth daily. 30 tablet 2  . ALPRAZolam (XANAX) 1 MG tablet TAKE 1 TABLET BY MOUTH EVERY DAY (Patient not taking: Reported on 04/02/2015) 30 tablet 0   No current facility-administered medications for this visit.    Previous Psychotropic Medications: No   Substance Abuse History in the last 12 months:  Yes.    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 Says it helps his anxiety,  Medical Decision Making:  Review of Psycho-Social Stressors (1), Decision to obtain old records (1), Order AIMS Test (2), Review of Medication Regimen & Side Effects (2) and Review of New Medication or Change in  Dosage (2)  Treatment Plan Summary: Medication management and Plan as follows  Bipolar disorder, depressed: He will continue Lamictal. There is no rash Lower Abilify to 10 mg . This is to cut down further on his tremors.\  Increase  Wellbutrin 200 mg for depression and  His concern of inattention. Reviewed side effects  GAD: not taking xanax.  discussed option of changing it to BuSpar or an SSRI if needed. Marijuana use: Cautioned that to abstain it and it can affect his judgment and cause more depression he remains reluctant and shows poor insight about its effect on his condition. Labs reviewed and are done by primary care Recommended psychotherapy to evaluate further on anxiety and depression but he does not give a clear trigger factor for depression. Patient is a non nicotine smoker More than 50% spent in counseling and coordination care including patient education Call 911 or report local emergency  room for any urgent concerns of suicidal thoughts Follow-up in 3-4 weeks or earlier if needed Time spent: 25 minutes   Mia Winthrop 11/4/201610:36 AM

## 2015-04-17 ENCOUNTER — Other Ambulatory Visit (HOSPITAL_COMMUNITY): Payer: Self-pay | Admitting: Psychiatry

## 2015-04-20 NOTE — Telephone Encounter (Signed)
Received medication request from [facility] for Wellbutrin 100mg . Per Dr. De Nurse, medication request is denied. Prescription was sent to pharmacy on 04/02/15.

## 2015-05-01 ENCOUNTER — Other Ambulatory Visit: Payer: Self-pay | Admitting: Sports Medicine

## 2015-05-10 ENCOUNTER — Encounter: Payer: Self-pay | Admitting: Emergency Medicine

## 2015-05-10 ENCOUNTER — Emergency Department (INDEPENDENT_AMBULATORY_CARE_PROVIDER_SITE_OTHER)
Admission: EM | Admit: 2015-05-10 | Discharge: 2015-05-10 | Disposition: A | Discharge status: Home / Self Care | Payer: BLUE CROSS/BLUE SHIELD | Source: Home / Self Care | Attending: Family Medicine | Admitting: Family Medicine

## 2015-05-10 ENCOUNTER — Emergency Department (INDEPENDENT_AMBULATORY_CARE_PROVIDER_SITE_OTHER): Payer: BLUE CROSS/BLUE SHIELD

## 2015-05-10 DIAGNOSIS — R1010 Upper abdominal pain, unspecified: Secondary | ICD-10-CM | POA: Diagnosis not present

## 2015-05-10 DIAGNOSIS — D72829 Elevated white blood cell count, unspecified: Secondary | ICD-10-CM

## 2015-05-10 DIAGNOSIS — R197 Diarrhea, unspecified: Secondary | ICD-10-CM | POA: Diagnosis not present

## 2015-05-10 DIAGNOSIS — R112 Nausea with vomiting, unspecified: Secondary | ICD-10-CM

## 2015-05-10 LAB — POCT INFLUENZA A/B
Influenza A, POC: NEGATIVE
Influenza B, POC: NEGATIVE

## 2015-05-10 LAB — POCT CBC W AUTO DIFF (K'VILLE URGENT CARE)

## 2015-05-10 MED ORDER — SODIUM CHLORIDE 0.9 % IV BOLUS (SEPSIS)
1000.0000 mL | Freq: Once | INTRAVENOUS | Status: AC
Start: 2015-05-10 — End: 2015-05-10
  Administered 2015-05-10: 1000 mL via INTRAVENOUS

## 2015-05-10 MED ORDER — PROMETHAZINE HCL 25 MG PO TABS: 25.0000 mg | ORAL_TABLET | Freq: Four times a day (QID) | ORAL | Status: DC | PRN

## 2015-05-10 MED ORDER — ONDANSETRON 4 MG PO TBDP
4.0000 mg | ORAL_TABLET | Freq: Once | ORAL | Status: AC
  Administered 2015-05-10: 4 mg via ORAL

## 2015-05-10 MED ORDER — KETOROLAC TROMETHAMINE 30 MG/ML IJ SOLN
30.0000 mg | Freq: Once | INTRAMUSCULAR | Status: AC
  Administered 2015-05-10: 30 mg via INTRAVENOUS

## 2015-05-10 NOTE — Discharge Instructions (Signed)
Today you were seen for nausea, vomiting, diarrhea and abdominal pain.  Your blood work showed a high white blood cell count which typically signifies an infection but it does not differentia between viral verse bacterial infection.  The ultrasound of your gallbladder today was normal.  This means your symptoms are most likely due to a virus.  Your stomach pain should start improving over the next 24-48 hours as your vomiting slows down after taking the medication.  Be sure to get plenty of rest and to stay well hydrated.  If you are not improving in 3-4 days, please follow up with your primary care provider.  If symptoms worsen including severe pain, fever, or unable to keep down fluids, please go to the emergency department for further evaluation and treatment.

## 2015-05-10 NOTE — ED Provider Notes (Signed)
CSN: [id number]     Arrival date & time 05/10/15  1418 History   First MD Initiated Contact with Patient 05/10/15 1423     Chief Complaint  Patient presents with  . Emesis   (Consider location/radiation/quality/duration/timing/severity/associated sxs/prior Treatment) HPI  Pt is a 19yo male presenting to [facility] with c/o n/v/d with associated upper abdominal pain that started this morning around 4AM.  Upper abdominal pain is sharp and cramping, moderate in severity.  He also developed mid-upper back pain after vomiting some this morning.  Pt reports vomiting about 10 times since this morning. No blood or bile in emesis.  He also reports having several episodes of loose stool without blood or mucous in stool.  His sister has been sick with a cold but his brother has also had stomach symptoms recently.  He cannot think of anything that he ate the same as his brother that may have been bad. Denies recent travel. Denies urinary symptoms. No hx of kidney stones.  Denies cough or congestion. Denies hx of abdominal surgeries. He has not tried any medications PTA.  Past Medical History  Diagnosis Date  . 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 Elevated BP 08/19/2012   History reviewed. No pertinent past surgical history. Family History  Problem Relation Age of Onset  . Depression Father   . Depression Maternal Grandmother    Social History  Substance Use Topics  . Smoking status: Never Smoker   . Smokeless tobacco: None  . Alcohol Use: No    Review of Systems  Constitutional: Negative for fever and chills.  HENT: Negative for congestion, ear pain, sore throat, trouble swallowing and voice change.   Respiratory: Negative for cough and shortness of breath.   Cardiovascular: Negative for chest pain and palpitations.  Gastrointestinal: Positive for nausea, vomiting, abdominal pain and diarrhea ( loose stool).  Musculoskeletal: Positive for myalgias and back pain. Negative for arthralgias.  Skin: Negative for  rash.  All other systems reviewed and are negative.   Allergies  Review of patient's allergies indicates not on file.  Home Medications   Prior to Admission medications   Medication Sig Start Date End Date Taking? Authorizing Provider  Adapalene-Benzoyl Peroxide (EPIDUO) 0.1-2.5 % gel Apply 1 application topically at bedtime. Patient needs to schedule a follow up appointment before more refills. 03/02/15   Marcial Pacas, DO  ALPRAZolam (XANAX) 1 MG tablet TAKE 1 TABLET BY MOUTH EVERY DAY Patient not taking: Reported on 04/02/2015 02/22/15   Marcial Pacas, DO  ARIPiprazole (ABILIFY) 10 MG tablet Take 1 tablet (10 mg total) by mouth daily. Lower tablet to 10mg  qd. 04/02/15   Merian Capron, MD  ARIPiprazole (ABILIFY) 20 MG tablet TAKE 1 TABLET (20 MG TOTAL) BY MOUTH DAILY. 05/02/15   Silverio Decamp, MD  buPROPion (WELLBUTRIN) 100 MG tablet Take 2 tablets (200 mg total) by mouth 2 (two) times daily. 04/02/15   Merian Capron, MD  lamoTRIgine (LAMICTAL) 200 MG tablet Take 1 tablet (200 mg total) by mouth daily. 03/15/15   Marcial Pacas, DO  promethazine (PHENERGAN) 25 MG tablet Take 1 tablet (25 mg total) by mouth every 6 (six) hours as needed for nausea or vomiting. 05/10/15   Noland Fordyce, PA-C   Meds Ordered and Administered this Visit   Medications  ondansetron (ZOFRAN-ODT) disintegrating tablet 4 mg (4 mg Oral Given 05/10/15 1458)  sodium chloride 0.9 % bolus 1,000 mL (1,000 mLs Intravenous Given 05/10/15 1455)  ketorolac (TORADOL) 30 MG/ML injection 30 mg (30 mg Intravenous Given  05/10/15 1456)    BP 136/88 mmHg  Pulse 96  Temp(Src) 98.2 F (36.8 C) (Oral)  Ht 5\' 9"  (1.753 m)  Wt 195 lb (88.451 kg)  BMI 28.78 kg/m2  SpO2 99% No data found.   Physical Exam  Constitutional: He appears well-developed and well-nourished.  HENT:  Head: Normocephalic and atraumatic.  Eyes: Conjunctivae are normal. No scleral icterus.  Neck: Normal range of motion. Neck supple.  Cardiovascular:  Normal rate, regular rhythm and normal heart sounds.   Pulmonary/Chest: Effort normal and breath sounds normal. No respiratory distress. He has no wheezes. He has no rales. He exhibits no tenderness.  Abdominal: Soft. Bowel sounds are normal. He exhibits no distension and no mass. There is tenderness. There is guarding. There is no rebound.  Soft, non-distended. Upper abdominal tenderness worse in RUQ and epigastrium. Some guarding. No rebound or masses palpated.   Musculoskeletal: Normal range of motion.  Neurological: He is alert.  Skin: Skin is warm and dry.  Nursing note and vitals reviewed.   ED Course  Procedures (including critical care time)  Labs Review Labs Reviewed  COMPLETE METABOLIC PANEL WITH GFR  POCT CBC W AUTO DIFF ([facility])    Imaging Review US Abdomen Complete  05/10/2015  CLINICAL DATA:  19 year old with upper abdominal pain, nausea, vomiting and diarrhea. EXAM: ULTRASOUND ABDOMEN COMPLETE COMPARISON:  None. FINDINGS: Gallbladder: No gallstones or wall thickening visualized. No sonographic Murphy sign noted. Common bile duct: Diameter: 0.2 cm Liver: No focal lesion identified. Within normal limits in parenchymal echogenicity. IVC: Poorly visualized due to bowel gas. Pancreas: Not visualized due to bowel gas. Spleen: Size and appearance within normal limits. Right Kidney: Length: 11.9 cm. Echogenicity within normal limits. No mass or hydronephrosis visualized. Left Kidney: Length: 10.2 cm. Echogenicity within normal limits. No mass or hydronephrosis visualized. Abdominal aorta: No aneurysm visualized. Other findings: None. IMPRESSION: No acute abnormality. Limited evaluation of some retroperitoneal structures due to bowel gas. Electronically Signed   By: Markus Daft M.D.   On: 05/10/2015 15:53      MDM   1. Leukocytosis   2. Upper abdominal pain   3. Nausea vomiting and diarrhea    Pt c/o sudden onset n/v/d with upper abdominal pain that started this  morning around 4AM. Ten episodes of vomiting.  Pt is tender in upper abdomen, worse in RUQ with some guarding.    Pt is afebrile and has hx of sick contact, brother also has GI symptoms.  Symptoms likely viral in nature, however, with RUQ tenderness and leukocytosis- WBC: 24.2, will r/o cholecystitis with U/S.   Pt denies urinary symptoms. Doubt pyelonephritis. Denies cough or congestion, doubt pneumonia.  No lower abdominal tenderness, doubt appendicitis at this time.   Tx in UC: Zofran 4mg  ODT, 1L IV fluids, Toradol 30mg  IV  U/S: normal appearing gallbladder.  After returning from U/S, pt stated he felt much better.  Pain went from 8/10 to 2/10.  Reassured pt and his mother.  Rx: phenergan  Encouraged fluids and rest. F/u with PCP in 3-4 days if not improving. Discussed symptoms that warrant emergent care in the ED. Pt and mother verbalized understanding and agreement with tx plan.   Noland Fordyce, PA-C 05/10/15 1620

## 2015-05-10 NOTE — ED Notes (Signed)
Woke up this morning with nausea and vomiting, mid back pain

## 2015-05-11 LAB — COMPLETE METABOLIC PANEL WITH GFR
ALT: 21 U/L (ref 8–46)
AST: 17 U/L (ref 12–32)
Albumin: 4.9 g/dL (ref 3.6–5.1)
Alkaline Phosphatase: 66 U/L (ref 48–230)
BUN: 12 mg/dL (ref 7–20)
CO2: 23 mmol/L (ref 20–31)
Calcium: 9.9 mg/dL (ref 8.9–10.4)
Chloride: 104 mmol/L (ref 98–110)
Creat: 0.73 mg/dL (ref 0.60–1.26)
GFR, Est African American: >89 mL/min (ref >=60)
GFR, Est Non African American: >89 mL/min (ref >=60)
Glucose, Bld: 111 mg/dL — ABNORMAL HIGH (ref 65–99)
Potassium: 3.9 mmol/L (ref 3.8–5.1)
Sodium: 139 mmol/L (ref 135–146)
Total Bilirubin: 1.3 mg/dL — ABNORMAL HIGH (ref 0.2–1.1)
Total Protein: 7.8 g/dL (ref 6.3–8.2)

## 2015-05-12 ENCOUNTER — Telehealth: Payer: Self-pay | Admitting: *Deleted

## 2015-11-03 ENCOUNTER — Emergency Department (INDEPENDENT_AMBULATORY_CARE_PROVIDER_SITE_OTHER): Payer: BLUE CROSS/BLUE SHIELD

## 2015-11-03 ENCOUNTER — Emergency Department (INDEPENDENT_AMBULATORY_CARE_PROVIDER_SITE_OTHER)
Admission: EM | Admit: 2015-11-03 | Discharge: 2015-11-03 | Disposition: A | Discharge status: Home / Self Care | Payer: BLUE CROSS/BLUE SHIELD | Source: Home / Self Care | Attending: Family Medicine | Admitting: Family Medicine

## 2015-11-03 ENCOUNTER — Encounter: Payer: Self-pay | Admitting: *Deleted

## 2015-11-03 DIAGNOSIS — S99911A Unspecified injury of right ankle, initial encounter: Secondary | ICD-10-CM | POA: Diagnosis not present

## 2015-11-03 DIAGNOSIS — S93401A Sprain of unspecified ligament of right ankle, initial encounter: Secondary | ICD-10-CM

## 2015-11-03 DIAGNOSIS — M79671 Pain in right foot: Secondary | ICD-10-CM | POA: Diagnosis not present

## 2015-11-03 DIAGNOSIS — S99921A Unspecified injury of right foot, initial encounter: Secondary | ICD-10-CM

## 2015-11-03 DIAGNOSIS — M25571 Pain in right ankle and joints of right foot: Secondary | ICD-10-CM | POA: Diagnosis not present

## 2015-11-03 MED ORDER — IBUPROFEN 600 MG PO TABS: 600.0000 mg | ORAL_TABLET | Freq: Four times a day (QID) | ORAL | Status: DC | PRN

## 2015-11-03 MED ORDER — HYDROCODONE-ACETAMINOPHEN 5-325 MG PO TABS: 1.0000 | ORAL_TABLET | Freq: Four times a day (QID) | ORAL | Status: DC | PRN

## 2015-11-03 NOTE — Discharge Instructions (Signed)
Norco/Vicodin (hydrocodone-acetaminophen) is a narcotic pain medication, do not combine these medications with others containing tylenol. While taking, do not drink alcohol, drive, or perform any other activities that requires focus while taking these medications.   Acute Ankle Sprain With Phase I Rehab An acute ankle sprain is a partial or complete tear in one or more of the ligaments of the ankle due to traumatic injury. The severity of the injury depends on both the number of ligaments sprained and the grade of sprain. There are 3 grades of sprains.   A grade 1 sprain is a mild sprain. There is a slight pull without obvious tearing. There is no loss of strength, and the muscle and ligament are the correct length.  A grade 2 sprain is a moderate sprain. There is tearing of fibers within the substance of the ligament where it connects two bones or two cartilages. The length of the ligament is increased, and there is usually decreased strength.  A grade 3 sprain is a complete rupture of the ligament and is uncommon. In addition to the grade of sprain, there are three types of ankle sprains.  Lateral ankle sprains: This is a sprain of one or more of the three ligaments on the outer side (lateral) of the ankle. These are the most common sprains. Medial ankle sprains: There is one large triangular ligament of the inner side (medial) of the ankle that is susceptible to injury. Medial ankle sprains are less common. Syndesmosis, "high ankle," sprains: The syndesmosis is the ligament that connects the two bones of the lower leg. Syndesmosis sprains usually only occur with very severe ankle sprains. SYMPTOMS  Pain, tenderness, and swelling in the ankle, starting at the side of injury that may progress to the whole ankle and foot with time.  "Pop" or tearing sensation at the time of injury.  Bruising that may spread to the heel.  Impaired ability to walk soon after injury. CAUSES   Acute ankle  sprains are caused by trauma placed on the ankle that temporarily forces or pries the anklebone (talus) out of its normal socket.  Stretching or tearing of the ligaments that normally hold the joint in place (usually due to a twisting injury). RISK INCREASES WITH:  Previous ankle sprain.  Sports in which the foot may land awkwardly (i.e., basketball, volleyball, or soccer) or walking or running on uneven or rough surfaces.  Shoes with inadequate support to prevent sideways motion when stress occurs.  Poor strength and flexibility.  Poor balance skills.  Contact sports. PREVENTION   Warm up and stretch properly before activity.  Maintain physical fitness:  Ankle and leg flexibility, muscle strength, and endurance.  Cardiovascular fitness.  Balance training activities.  Use proper technique and have a coach correct improper technique.  Taping, protective strapping, bracing, or high-top tennis shoes may help prevent injury. Initially, tape is best; however, it loses most of its support function within 10 to 15 minutes.  Wear proper-fitted protective shoes (High-top shoes with taping or bracing is more effective than either alone).  Provide the ankle with support during sports and practice activities for 12 months following injury. PROGNOSIS   If treated properly, ankle sprains can be expected to recover completely; however, the length of recovery depends on the degree of injury.  A grade 1 sprain usually heals enough in 5 to 7 days to allow modified activity and requires an average of 6 weeks to heal completely.  A grade 2 sprain requires 6 to 10  weeks to heal completely.  A grade 3 sprain requires 12 to 16 weeks to heal.  A syndesmosis sprain often takes more than 3 months to heal. RELATED COMPLICATIONS   Frequent recurrence of symptoms may result in a chronic problem. Appropriately addressing the problem the first time decreases the frequency of recurrence and optimizes  healing time. Severity of the initial sprain does not predict the likelihood of later instability.  Injury to other structures (bone, cartilage, or tendon).  A chronically unstable or arthritic ankle joint is a possibility with repeated sprains. TREATMENT Treatment initially involves the use of ice, medication, and compression bandages to help reduce pain and inflammation. Ankle sprains are usually immobilized in a walking cast or boot to allow for healing. Crutches may be recommended to reduce pressure on the injury. After immobilization, strengthening and stretching exercises may be necessary to regain strength and a full range of motion. Surgery is rarely needed to treat ankle sprains. MEDICATION   Nonsteroidal anti-inflammatory medications, such as aspirin and ibuprofen (do not take for the first 3 days after injury or within 7 days before surgery), or other minor pain relievers, such as acetaminophen, are often recommended. Take these as directed by your caregiver. Contact your caregiver immediately if any bleeding, stomach upset, or signs of an allergic reaction occur from these medications.  Ointments applied to the skin may be helpful.  Pain relievers may be prescribed as necessary by your caregiver. Do not take prescription pain medication for longer than 4 to 7 days. Use only as directed and only as much as you need. HEAT AND COLD  Cold treatment (icing) is used to relieve pain and reduce inflammation for acute and chronic cases. Cold should be applied for 10 to 15 minutes every 2 to 3 hours for inflammation and pain and immediately after any activity that aggravates your symptoms. Use ice packs or an ice massage.  Heat treatment may be used before performing stretching and strengthening activities prescribed by your caregiver. Use a heat pack or a warm soak. SEEK IMMEDIATE MEDICAL CARE IF:   Pain, swelling, or bruising worsens despite treatment.  You experience pain, numbness,  discoloration, or coldness in the foot or toes.  New, unexplained symptoms develop (drugs used in treatment may produce side effects.) EXERCISES  PHASE I EXERCISES RANGE OF MOTION (ROM) AND STRETCHING EXERCISES - Ankle Sprain, Acute Phase I, Weeks 1 to 2 These exercises may help you when beginning to restore flexibility in your ankle. You will likely work on these exercises for the 1 to 2 weeks after your injury. Once your physician, physical therapist, or athletic trainer sees adequate progress, he or she will advance your exercises. While completing these exercises, remember:   Restoring tissue flexibility helps normal motion to return to the joints. This allows healthier, less painful movement and activity.  An effective stretch should be held for at least 30 seconds.  A stretch should never be painful. You should only feel a gentle lengthening or release in the stretched tissue. RANGE OF MOTION - Dorsi/Plantar Flexion  While sitting with your right / left knee straight, draw the top of your foot upwards by flexing your ankle. Then reverse the motion, pointing your toes downward.  Hold each position for __________ seconds.  After completing your first set of exercises, repeat this exercise with your knee bent. Repeat __________ times. Complete this exercise __________ times per day.  RANGE OF MOTION - Ankle Alphabet  Imagine your right / left big toe  is a pen.  Keeping your hip and knee still, write out the entire alphabet with your "pen." Make the letters as large as you can without increasing any discomfort. Repeat __________ times. Complete this exercise __________ times per day.  STRENGTHENING EXERCISES - Ankle Sprain, Acute -Phase I, Weeks 1 to 2 These exercises may help you when beginning to restore strength in your ankle. You will likely work on these exercises for 1 to 2 weeks after your injury. Once your physician, physical therapist, or athletic trainer sees adequate  progress, he or she will advance your exercises. While completing these exercises, remember:   Muscles can gain both the endurance and the strength needed for everyday activities through controlled exercises.  Complete these exercises as instructed by your physician, physical therapist, or athletic trainer. Progress the resistance and repetitions only as guided.  You may experience muscle soreness or fatigue, but the pain or discomfort you are trying to eliminate should never worsen during these exercises. If this pain does worsen, stop and make certain you are following the directions exactly. If the pain is still present after adjustments, discontinue the exercise until you can discuss the trouble with your clinician. STRENGTH - Dorsiflexors  Secure a rubber exercise band/tubing to a fixed object (i.e., table, pole) and loop the other end around your right / left foot.  Sit on the floor facing the fixed object. The band/tubing should be slightly tense when your foot is relaxed.  Slowly draw your foot back toward you using your ankle and toes.  Hold this position for __________ seconds. Slowly release the tension in the band and return your foot to the starting position. Repeat __________ times. Complete this exercise __________ times per day.  STRENGTH - Plantar-flexors   Sit with your right / left leg extended. Holding onto both ends of a rubber exercise band/tubing, loop it around the ball of your foot. Keep a slight tension in the band.  Slowly push your toes away from you, pointing them downward.  Hold this position for __________ seconds. Return slowly, controlling the tension in the band/tubing. Repeat __________ times. Complete this exercise __________ times per day.  STRENGTH - Ankle Eversion  Secure one end of a rubber exercise band/tubing to a fixed object (table, pole). Loop the other end around your foot just before your toes.  Place your fists between your knees. This will  focus your strengthening at your ankle.  Drawing the band/tubing across your opposite foot, slowly, pull your little toe out and up. Make sure the band/tubing is positioned to resist the entire motion.  Hold this position for __________ seconds. Have your muscles resist the band/tubing as it slowly pulls your foot back to the starting position.  Repeat __________ times. Complete this exercise __________ times per day.  STRENGTH - Ankle Inversion  Secure one end of a rubber exercise band/tubing to a fixed object (table, pole). Loop the other end around your foot just before your toes.  Place your fists between your knees. This will focus your strengthening at your ankle.  Slowly, pull your big toe up and in, making sure the band/tubing is positioned to resist the entire motion.  Hold this position for __________ seconds.  Have your muscles resist the band/tubing as it slowly pulls your foot back to the starting position. Repeat __________ times. Complete this exercises __________ times per day.  STRENGTH - Towel Curls  Sit in a chair positioned on a non-carpeted surface.  Place your right /  left foot on a towel, keeping your heel on the floor.  Pull the towel toward your heel by only curling your toes. Keep your heel on the floor.  If instructed by your physician, physical therapist, or athletic trainer, add weight to the end of the towel. Repeat __________ times. Complete this exercise __________ times per day.   This information is not intended to replace advice given to you by your health care provider. Make sure you discuss any questions you have with your health care provider.   Document Released: 12/14/2004 Document Revised: 06/05/2014 Document Reviewed: 08/27/2008 Elsevier Interactive Patient Education [employer].

## 2015-11-03 NOTE — ED Provider Notes (Signed)
CSN: [id number]     Arrival date & time 11/03/15  1610 History   First MD Initiated Contact with Patient 11/03/15 1612     Chief Complaint  Patient presents with  . Ankle Pain   (Consider location/radiation/quality/duration/timing/severity/associated sxs/prior Treatment) Patient is a 20 y.o. male presenting with ankle pain. The history is provided by the patient.  Ankle Pain Location:  Ankle and foot Time since incident:  25 minutes Injury: yes   Mechanism of injury: fall   Fall:    Fall occurred:  Running and recreating/playing   Height of fall:  Ground level   Impact surface:  Hard floor   Point of impact:  Outstretched arms and feet   Entrapped after fall: no   Ankle location:  R ankle Foot location:  R foot Pain details:    Quality:  Aching and dull   Radiates to:  Does not radiate   Severity:  Moderate   Onset quality:  Sudden   Timing:  Constant   Progression:  Unchanged Chronicity:  New Dislocation: no   Foreign body present:  No foreign bodies Prior injury to area:  No Relieved by:  None tried Worsened by:  Activity, bearing weight, flexion and extension Ineffective treatments:  NSAIDs Associated symptoms: decreased ROM and swelling   Associated symptoms: no back pain, no numbness and no tingling   Risk factors: no frequent fractures and no known bone disorder     The pt is a 20yo male driven to [facility] by his girlfriend with c/o Right foot and ankle pain after twisting ankle at a trampoline park 20-30 minutes PTA.    Past Medical History  Diagnosis Date  . 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 Elevated BP 08/19/2012   History reviewed. No pertinent past surgical history. Family History  Problem Relation Age of Onset  . Depression Father   . Depression Maternal Grandmother    Social History  Substance Use Topics  . Smoking status: Current Every Day Smoker -- 0.10 packs/day    Types: Cigarettes  . Smokeless tobacco: None  . Alcohol Use: No    Review of Systems   Musculoskeletal: Positive for myalgias, joint swelling, arthralgias and gait problem. Negative for back pain.       Right foot and ankle  Skin: Negative for color change and wound.  Neurological: Positive for weakness ( Right ankle due to pain). Negative for numbness.    Allergies  Review of patient's allergies indicates no known allergies.  Home Medications   Prior to Admission medications   Medication Sig Start Date End Date Taking? Authorizing Provider  HYDROcodone-acetaminophen (NORCO/VICODIN) 5-325 MG tablet Take 1-2 tablets by mouth every 6 (six) hours as needed for moderate pain. 11/03/15   Noland Fordyce, PA-C  ibuprofen (ADVIL,MOTRIN) 600 MG tablet Take 1 tablet (600 mg total) by mouth every 6 (six) hours as needed. 11/03/15   Noland Fordyce, PA-C   Meds Ordered and Administered this Visit  Medications - No data to display  BP 114/70 mmHg  Pulse 99  Temp(Src) 97.9 F (36.6 C) (Oral)  Resp 16  SpO2 100% No data found.   Physical Exam  Constitutional: He is oriented to person, place, and time. He appears well-developed and well-nourished.  HENT:  Head: Normocephalic and atraumatic.  Eyes: EOM are normal.  Neck: Normal range of motion.  Cardiovascular: Normal rate.   Pulses:      Dorsalis pedis pulses are 2+ on the right side.       Posterior  tibial pulses are 2+ on the right side.  Pulmonary/Chest: Effort normal.  Musculoskeletal: He exhibits edema and tenderness.  Right ankle and foot: mild edema to lateral ankle, dorsum of foot with tenderness. Limited dorsiflexion and plantarflexion due to pain. Unable to bear weight. Calf: soft, non-tender. Right knee: full ROM, non-tender.  Neurological: He is alert and oriented to person, place, and time.  Right foot: normal sensation.  Skin: Skin is warm and dry.  Right foot and ankle: skin in tact. No ecchymosis or erythema.  Psychiatric: He has a normal mood and affect. His behavior is normal.  Nursing note and vitals  reviewed.   ED Course  Procedures (including critical care time)  Labs Review Labs Reviewed - No data to display  Imaging Review Dg Ankle Complete Right  11/03/2015  CLINICAL DATA:  Twisting injury on trampoline today with right foot and ankle pain. EXAM: RIGHT ANKLE - COMPLETE 3+ VIEW COMPARISON:  None. FINDINGS: There is no evidence of fracture, dislocation, or joint effusion. There is no evidence of arthropathy or other focal bone abnormality. Soft tissues are unremarkable. IMPRESSION: Negative. Electronically Signed   By: Marin Olp M.D.   On: 11/03/2015 16:39   Dg Foot Complete Right  11/03/2015  CLINICAL DATA:  Twisting injury on trampoline today with lateral foot and ankle pain. EXAM: RIGHT FOOT COMPLETE - 3+ VIEW COMPARISON:  None. FINDINGS: There is no evidence of fracture or dislocation. There is no evidence of arthropathy or other focal bone abnormality. Soft tissues are unremarkable. IMPRESSION: Negative. Electronically Signed   By: Marin Olp M.D.   On: 11/03/2015 16:38      MDM   1. Right ankle sprain, initial encounter   2. Right ankle injury, initial encounter   3. Right foot injury, initial encounter    Right foot and ankle injury just PTA. Pulse and sensation in tact. Limited ROM due to pain.  Plain films: negative for fracture or dislocation. Will treat as sprain.  Ankle air splint applied, crutches provided. Rx: norco and ibuprofen. Declined work note.  Encouraged rest, ice, compression and elevation. Pt info on ankle sprains with ankle exercises provided. Encouraged to gradually apply weight to Right foot as pain subsides. F/u with Sports Medicine in 1-2 weeks if not improving. Patient verbalized understanding and agreement with treatment plan.     Noland Fordyce, PA-C 11/03/15 1725

## 2015-11-03 NOTE — ED Notes (Signed)
Pt c/o RT ankle injury 20 minutes ago after "rolling" it while jumping on a trampoline. He took 3 Advil on the way to the clinic.

## 2016-01-28 ENCOUNTER — Ambulatory Visit (INDEPENDENT_AMBULATORY_CARE_PROVIDER_SITE_OTHER): Payer: BLUE CROSS/BLUE SHIELD | Admitting: Sports Medicine

## 2016-01-28 DIAGNOSIS — M25571 Pain in right ankle and joints of right foot: Secondary | ICD-10-CM | POA: Diagnosis not present

## 2016-01-28 MED ORDER — MELOXICAM 15 MG PO TABS: ORAL_TABLET | ORAL | 3 refills | Status: DC

## 2016-01-28 NOTE — Progress Notes (Signed)
   Subjective:    I'm seeing this patient as a consultation for:  Dr. Marcial Pacas  CC: Right ankle pain  HPI: 2 months ago this pleasant 20 year old male sprained his ankle, he had x-rays that were negative for fracture, he went through 2 months of physician directed rehabilitation but unfortunately continues to have pain in the localizes over the ATFL. Pain is moderate, persistent without radiation. No mechanical symptoms.  Past medical history, Surgical history, Family history not pertinant except as noted below, Social history, Allergies, and medications have been entered into the medical record, reviewed, and no changes needed.   Review of Systems: No headache, visual changes, nausea, vomiting, diarrhea, constipation, dizziness, abdominal pain, skin rash, fevers, chills, night sweats, weight loss, swollen lymph nodes, body aches, joint swelling, muscle aches, chest pain, shortness of breath, mood changes, visual or auditory hallucinations.   Objective:   General: Well Developed, well nourished, and in no acute distress.  Neuro/Psych: Alert and oriented x3, extra-ocular muscles intact, able to move all 4 extremities, sensation grossly intact. Skin: Warm and dry, no rashes noted.  Respiratory: Not using accessory muscles, speaking in full sentences, trachea midline.  Cardiovascular: Pulses palpable, no extremity edema. Abdomen: Does not appear distended. Right Ankle: Visibly slightly swollen with tenderness over the ATFL. Range of motion is full in all directions. Strength is 5/5 in all directions. Stable lateral and medial ligaments; squeeze test and kleiger test unremarkable; Talar dome nontender; No pain at base of 5th MT; No tenderness over cuboid; No tenderness over N spot or navicular prominence No tenderness on posterior aspects of lateral and medial malleolus No sign of peroneal tendon subluxations; Negative tarsal tunnel tinel's Able to walk 4 steps.  Impression and  Recommendations:   This case required medical decision making of moderate complexity.  Right ankle pain Negative x-rays after injury 2 months ago, pain over the ATFL. Adding meloxicam, has already failed 6 weeks of physician directed rehabilitation so adding formal physical therapy, MRI, patient will continue the Aircast that he already has, and return to see me for MRI results.

## 2016-01-28 NOTE — Assessment & Plan Note (Signed)
Negative x-rays after injury 2 months ago, pain over the ATFL. Adding meloxicam, has already failed 6 weeks of physician directed rehabilitation so adding formal physical therapy, MRI, patient will continue the Aircast that he already has, and return to see me for MRI results.

## 2016-02-07 ENCOUNTER — Ambulatory Visit (INDEPENDENT_AMBULATORY_CARE_PROVIDER_SITE_OTHER): Payer: BLUE CROSS/BLUE SHIELD

## 2016-02-07 DIAGNOSIS — X501XXA Overexertion from prolonged static or awkward postures, initial encounter: Secondary | ICD-10-CM | POA: Diagnosis not present

## 2016-02-07 DIAGNOSIS — S92214A Nondisplaced fracture of cuboid bone of right foot, initial encounter for closed fracture: Secondary | ICD-10-CM

## 2016-02-16 ENCOUNTER — Ambulatory Visit (INDEPENDENT_AMBULATORY_CARE_PROVIDER_SITE_OTHER): Payer: BLUE CROSS/BLUE SHIELD | Admitting: Family Medicine

## 2016-02-16 ENCOUNTER — Encounter: Payer: Self-pay | Admitting: Family Medicine

## 2016-02-16 VITALS — BP 146/79 | HR 83 | Wt 231.0 lb

## 2016-02-16 DIAGNOSIS — R030 Elevated blood-pressure reading, without diagnosis of hypertension: Secondary | ICD-10-CM

## 2016-02-16 DIAGNOSIS — S92213A Displaced fracture of cuboid bone of unspecified foot, initial encounter for closed fracture: Secondary | ICD-10-CM | POA: Insufficient documentation

## 2016-02-16 DIAGNOSIS — IMO0001 Reserved for inherently not codable concepts without codable children: Secondary | ICD-10-CM

## 2016-02-16 DIAGNOSIS — S92211A Displaced fracture of cuboid bone of right foot, initial encounter for closed fracture: Secondary | ICD-10-CM

## 2016-02-16 NOTE — Patient Instructions (Addendum)
Thank you for coming in today. Use the walking cast Return for recheck in 2 weeks. .   Cast or Splint Care Casts and splints support injured limbs and keep bones from moving while they heal. It is important to care for your cast or splint at home.  HOME CARE INSTRUCTIONS  Keep the cast or splint uncovered during the drying period. It can take 24 to 48 hours to dry if it is made of plaster. A fiberglass cast will dry in less than 1 hour.  Do not rest the cast on anything harder than a pillow for the first 24 hours.  Do not put weight on your injured limb or apply pressure to the cast until your health care provider gives you permission.  Keep the cast or splint dry. Wet casts or splints can lose their shape and may not support the limb as well. A wet cast that has lost its shape can also create harmful pressure on your skin when it dries. Also, wet skin can become infected.  Cover the cast or splint with a plastic bag when bathing or when out in the rain or snow. If the cast is on the trunk of the body, take sponge baths until the cast is removed.  If your cast does become wet, dry it with a towel or a blow dryer on the cool setting only.  Keep your cast or splint clean. Soiled casts may be wiped with a moistened cloth.  Do not place any hard or soft foreign objects under your cast or splint, such as cotton, toilet paper, lotion, or powder.  Do not try to scratch the skin under the cast with any object. The object could get stuck inside the cast. Also, scratching could lead to an infection. If itching is a problem, use a blow dryer on a cool setting to relieve discomfort.  Do not trim or cut your cast or remove padding from inside of it.  Exercise all joints next to the injury that are not immobilized by the cast or splint. For example, if you have a long leg cast, exercise the hip joint and toes. If you have an arm cast or splint, exercise the shoulder, elbow, thumb, and  fingers.  Elevate your injured arm or leg on 1 or 2 pillows for the first 1 to 3 days to decrease swelling and pain.It is best if you can comfortably elevate your cast so it is higher than your heart. SEEK MEDICAL CARE IF:   Your cast or splint cracks.  Your cast or splint is too tight or too loose.  You have unbearable itching inside the cast.  Your cast becomes wet or develops a soft spot or area.  You have a bad smell coming from inside your cast.  You get an object stuck under your cast.  Your skin around the cast becomes red or raw.  You have new pain or worsening pain after the cast has been applied. SEEK IMMEDIATE MEDICAL CARE IF:   You have fluid leaking through the cast.  You are unable to move your fingers or toes.  You have discolored (blue or white), cool, painful, or very swollen fingers or toes beyond the cast.  You have tingling or numbness around the injured area.  You have severe pain or pressure under the cast.  You have any difficulty with your breathing or have shortness of breath.  You have chest pain.   This information is not intended to replace advice given  to you by your health care provider. Make sure you discuss any questions you have with your health care provider.   Document Released: 05/12/2000 Document Revised: 03/05/2013 Document Reviewed: 11/21/2012 Elsevier Interactive Patient Education 2016 [employer].     Tarsal Fracture With Rehab The tarsal bones are a collection of 7 bones that collectively make up the back half of the foot (hind foot). A tarsal fracture is a break in one of these bones. The 7 tarsal bones are the ankle bone (talus), heel bone (calcaneus), the 4 cuneiform bones, the cuboid, and the navicular. SYMPTOMS   Severe pain at the time of injury, which may persist for several weeks.  Tenderness, inflammation, or bruising (contusion) at the fracture site.  Swelling within the foot, causing numbness and paralysis,  which are signs of nerve and blood vessel damage (uncommon). CAUSES  Tarsal fractures occur when a force is placed on the bone that is greater than the bone can withstand. Common causes of injury include:  Direct trauma (injury from impact) to the foot.  Awkwardly landing on the foot after jumping.  Twisting injury to the ankle and foot. RISK INCREASES WITH:  Contact sports (football, rugby, or lacrosse).  Jumping sports (basketball or volleyball).  Previous ankle or foot injury.  Poor strength and flexibility. PREVENTION  Warm up and stretch properly before activity.  Maintain physical fitness:  Strength, flexibility, and endurance.  Cardiovascular fitness (increases heart rate).  Protect the ankle and foot with taping, braces, or compression bandages.  Wear properly fitted shoes that are appropriate for the activity. PROGNOSIS  If treated properly, tarsal fractures can be expected to heal with non-surgical treatment. Occasionally, surgery is necessary to treat bone fragments that are out of alignment (displaced fracture). RELATED COMPLICATIONS   Failure of the fracture to heal (nonunion).  Healing of the fracture in a poor position (malunion).  Recurring symptoms that result in a chronic problem.  Prolonged healing time, if improperly treated or re-injured.  Bone death, due to poor circulation to the fracture site (rare).  Arthritis of the foot. TREATMENT  Treatment first involves the use of ice and medicine, to reduce pain and inflammation. If the bone fragments are out of alignment (displaced fracture), then the bone must be immediately realigned (reduction) by a trained professional. Fractures that cannot be realigned by hand, or fractures where the bones poke out through the skin (open fracture), may require surgery to hold the fracture in place with screws, pins, and plates. Once in proper alignment, the foot and ankle must be kept still for a period of time to  allow for healing. After this period, it is important to perform strengthening and stretching exercises to help regain strength and a full range of motion. These exercises may be completed at home or with a therapist.  MEDICATION   If pain medicine is necessary, nonsteroidal anti-inflammatory medications (aspirin and ibuprofen) or other minor pain relievers (acetaminophen) are often recommended.  Do not take pain medication for 7 days before surgery.  Prescription pain relievers may be given if your caregiver thinks they are necessary. Use only as directed and only as much as you need. COLD THERAPY  Cold treatment (icing) relieves pain and reduces inflammation. Cold treatment should be applied for 10 to 15 minutes every 2 to 3 hours, and immediately after any activity that aggravates your symptoms. Use ice packs or an ice massage. SEEK MEDICAL CARE IF:   Treatment does not seem to help, or the condition worsens.  Any medicines produce negative side effects.  Any complications from surgery occur:  Pain, numbness, or coldness in the affected foot.  Discoloration beneath the toenails (blue or gray) of the affected foot.  Signs of infections (fever, pain, inflammation, redness, or persistent bleeding).

## 2016-02-16 NOTE — Progress Notes (Signed)
       Wesley Gomez is a 20 y.o. male who presents to [place]: [place] today for right foot fracture. Patient suffered an injury several months ago that was eventually worked up with an MRI a few weeks ago showing fracture of the cuboid fracture. This is nondisplaced. He presents to clinic today to have definitive treatment. He notes pain in the lateral aspect of the foot with ambulation. He denies any radiating pain weakness or numbness fevers or chills.   Past Medical History:  Diagnosis Date  . Elevated BP 08/19/2012  . 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 Social History  Substance Use Topics  . Smoking status: Current Every Day Smoker    Packs/day: 0.10    Types: Cigarettes  . Smokeless tobacco: Not on file  . Alcohol use No   family history includes Depression in his father and maternal grandmother.  ROS as above:  Medications: No current outpatient prescriptions on file.   No current facility-administered medications for this visit.    No Known Allergies   Exam:  BP (!) 146/79   Pulse 83   Wt 231 lb (104.8 kg)   BMI 34.11 kg/m  Gen: Well NAD Right foot is normal appearing and nontender. Pain is weightbearing with a flexed foot position felt laterally. Pulses capillary refill and sensation intact.  Patient was placed into a well made short leg walking cast   Study Result   CLINICAL DATA:  Two month history of foot pain. Sprained ankle 2 months ago.  EXAM: MRI OF THE RIGHT ANKLE WITHOUT CONTRAST  TECHNIQUE: Multiplanar, multisequence MR imaging of the ankle was performed. No intravenous contrast was administered.  COMPARISON:  Radiographs 11/03/2015  FINDINGS: TENDONS  Peroneal: Intact.  Posteromedial: Intact.  Anterior: Intact.  Achilles: Normal  Plantar Fascia: Intact  LIGAMENTS  Lateral:  Intact.  Medial: Intact.  CARTILAGE  Ankle Joint: Normal  Subtalar Joints/Sinus Tarsi: Small subtalar joint effusions. The sinus tarsi is normal. The cervical and interosseous ligaments are intact. The spring ligament is intact.  Bones:  There are cuboid fractures. Nondisplaced fracture through the midportion and probable subchondral stress fracture near the calcaneocuboid joint. Moderate surrounding marrow edema. No other fractures are identified.  Other: None  IMPRESSION: Nondisplaced cuboid fractures.  Intact medial and lateral ankle ligaments and tendons.   Electronically Signed   By: Marijo Sanes M.D.   On: 02/07/2016 13:32     No results found for this or any previous visit (from the past 24 hour(s)). No results found.    Assessment and Plan: 20 y.o. male with cuboid fracture right foot. Plan for walking cast. Weightbearing as tolerated. Recheck in 2 weeks.  Elevated blood pressure: Previously normal. Recheck in 2 weeks.   No orders of the defined types were placed in this encounter.   Discussed warning signs or symptoms. Please see discharge instructions. Patient expresses understanding.

## 2016-02-23 ENCOUNTER — Encounter: Payer: Self-pay | Admitting: Family Medicine

## 2016-02-23 ENCOUNTER — Ambulatory Visit (INDEPENDENT_AMBULATORY_CARE_PROVIDER_SITE_OTHER): Payer: BLUE CROSS/BLUE SHIELD | Admitting: Family Medicine

## 2016-02-23 VITALS — BP 132/79 | HR 93 | Wt 232.0 lb

## 2016-02-23 DIAGNOSIS — S92211A Displaced fracture of cuboid bone of right foot, initial encounter for closed fracture: Secondary | ICD-10-CM | POA: Diagnosis not present

## 2016-02-23 NOTE — Progress Notes (Signed)
       Wesley Gomez is a 20 y.o. male who presents to [facility] today for foot pain. Patient was seen last week for cuboid fracture. He was placed into a short-leg walking cast. A few days ago his cast got soaking wet and painful. He removed the cast with a box cutter. He feels well. No fevers or chills.   Past Medical History:  Diagnosis Date  . Elevated BP 08/19/2012  . 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 Social History  Substance Use Topics  . Smoking status: Current Every Day Smoker    Packs/day: 0.10    Types: Cigarettes  . Smokeless tobacco: Not on file  . Alcohol use No   family history includes Depression in his father and maternal grandmother.  ROS as above:  Medications: No current outpatient prescriptions on file.   No current facility-administered medications for this visit.    No Known Allergies   Exam:  BP 132/79   Pulse 93   Wt 232 lb (105.2 kg)   BMI 34.26 kg/m  Gen: Well NAD Right foot: Normal-appearing nontender  No results found for this or any previous visit (from the past 24 hour(s)). No results found.    Assessment and Plan: 20 y.o. male with Doing well. Patient was placed into a cam walker boot. Recheck in one month.   No orders of the defined types were placed in this encounter.   Discussed warning signs or symptoms. Please see discharge instructions. Patient expresses understanding.

## 2016-03-22 ENCOUNTER — Ambulatory Visit (INDEPENDENT_AMBULATORY_CARE_PROVIDER_SITE_OTHER): Payer: BLUE CROSS/BLUE SHIELD | Admitting: Family Medicine

## 2016-03-22 ENCOUNTER — Encounter: Payer: Self-pay | Admitting: Family Medicine

## 2016-03-22 VITALS — BP 138/64 | HR 85 | Wt 232.0 lb

## 2016-03-22 DIAGNOSIS — S92214A Nondisplaced fracture of cuboid bone of right foot, initial encounter for closed fracture: Secondary | ICD-10-CM | POA: Diagnosis not present

## 2016-03-22 NOTE — Progress Notes (Signed)
       Wesley Gomez is a 20 y.o. male who presents to [place]: [place] today for follow up foot pain. Patient has been seen several times for initially radiographically occult cuboid bone fracture in the right foot. He was treated for the last several weeks with a cam walker boot. He notes he is feeling much better now and is interested in returning to work.   Past Medical History:  Diagnosis Date  . Elevated BP 08/19/2012  . 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 Social History  Substance Use Topics  . Smoking status: Current Every Day Smoker    Packs/day: 0.10    Types: Cigarettes  . Smokeless tobacco: Not on file  . Alcohol use No   family history includes Depression in his father and maternal grandmother.  ROS as above:  Medications: No current outpatient prescriptions on file.   No current facility-administered medications for this visit.    No Known Allergies  Health Maintenance Health Maintenance  Topic Date Due  . HIV Screening  05/10/2011  . INFLUENZA VACCINE  02/15/2017 (Originally 12/28/2015)  . TETANUS/TDAP  11/17/2024     Exam:  BP 138/64   Pulse 85   Wt 232 lb (105.2 kg)   BMI 34.26 kg/m  Gen: Well NAD Right foot is normal appearing and nontender.  No results found for this or any previous visit (from the past 72 hour(s)). No results found.    Assessment and Plan: 20 y.o. male with resolving cuboid fracture right foot. Discontinue can walk boot. Recommend patient return to clinic in the near future for custom orthotics to support the arch per traditional guidelines to treat this fracture.   No orders of the defined types were placed in this encounter.   Discussed warning signs or symptoms. Please see discharge instructions. Patient expresses understanding.

## 2016-03-22 NOTE — Patient Instructions (Signed)
Thank you for coming in today. Return for orthotics in the near future.  Bring several of your shoes to the visit.  Return sooner if needed.

## 2020-07-29 ENCOUNTER — Ambulatory Visit (INDEPENDENT_AMBULATORY_CARE_PROVIDER_SITE_OTHER): Payer: Self-pay

## 2020-07-29 ENCOUNTER — Encounter: Payer: Self-pay | Admitting: Emergency Medicine

## 2020-07-29 ENCOUNTER — Other Ambulatory Visit: Payer: Self-pay

## 2020-07-29 ENCOUNTER — Ambulatory Visit
Admission: EM | Admit: 2020-07-29 | Discharge: 2020-07-29 | Disposition: A | Discharge status: Home / Self Care | Payer: BLUE CROSS/BLUE SHIELD | Attending: Family Medicine | Admitting: Family Medicine

## 2020-07-29 DIAGNOSIS — S61412A Laceration without foreign body of left hand, initial encounter: Secondary | ICD-10-CM

## 2020-07-29 DIAGNOSIS — S80219A Abrasion, unspecified knee, initial encounter: Secondary | ICD-10-CM

## 2020-07-29 DIAGNOSIS — M79642 Pain in left hand: Secondary | ICD-10-CM

## 2020-07-29 MED ORDER — KETOROLAC TROMETHAMINE 60 MG/2ML IM SOLN
60.0000 mg | Freq: Once | INTRAMUSCULAR | Status: AC
  Administered 2020-07-29: 60 mg via INTRAMUSCULAR

## 2020-07-29 MED ORDER — CEPHALEXIN 500 MG PO CAPS: 500.0000 mg | ORAL_CAPSULE | Freq: Four times a day (QID) | ORAL | 0 refills | Status: AC

## 2020-07-29 NOTE — ED Triage Notes (Signed)
Jumped off platform, landed on gravel with hands and knees.  Puncture wound to left palm.  patient is able to move left hand and fingers.  Patient has abrasions to both knees

## 2020-07-29 NOTE — ED Provider Notes (Signed)
[facility]    CSN: [id number] Arrival date & time: 07/29/20  1119      History   Chief Complaint Chief Complaint  Patient presents with  . Hand Injury    HPI Wesley Gomez is a 25 y.o. male.   Patient presenting today with left hand pain and laceration as well as some abrasions to both knees after he jumped off of a deck today and landed on some gravel.  States he is able to move all 5 fingers on the left hand and has no numbness or tingling in them.  Significant pain to laceration of hand but otherwise no major areas of concern.  He states this happened about 2 hours ago.  Has not tried anything for symptoms yet.  States he thinks he has had a tetanus shot in the past 5 or so years and does not wish to update this today.     Past Medical History:  Diagnosis Date  . Elevated BP 08/19/2012  . 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 Cuboid fracture 02/16/2016  . Generalized anxiety disorder 01/12/2015  . Bipolar disorder ([place]) 11/16/2014  . 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 Elevated BP 08/19/2012  . Acne 08/19/2012    History reviewed. No pertinent surgical history.     Home Medications    Prior to Admission medications   Medication Sig Start Date End Date Taking? Authorizing Provider  cephALEXin (KEFLEX) 500 MG capsule Take 1 capsule (500 mg total) by mouth 4 (four) times daily. 07/29/20  Yes Volney American, PA-C    Family History Family History  Problem Relation Age of Onset  . Depression Father   . Depression Maternal Grandmother     Social History Social History   Tobacco Use  . Smoking status: Current Every Day Smoker    Packs/day: 0.10    Types: Cigarettes  Substance Use Topics  . Alcohol use: No  . Drug use: No     Allergies   Patient has no known allergies.   Review of Systems Review of Systems Per HPI Physical Exam Triage Vital Signs ED Triage Vitals  Enc Vitals Group      BP 07/29/20 1140 (!) 150/92     Pulse Rate 07/29/20 1140 82     Resp 07/29/20 1140 18     Temp 07/29/20 1140 99 F (37.2 C)     Temp Source 07/29/20 1140 Oral     SpO2 07/29/20 1140 98 %     Weight --      Height --      Head Circumference --      Peak Flow --      Pain Score 07/29/20 1138 6     Pain Loc --      Pain Edu? --      Excl. in [place]? --    No data found.  Updated Vital Signs BP (!) 150/92 (BP Location: Right Arm)   Pulse 82   Temp 99 F (37.2 C) (Oral)   Resp 18   SpO2 98%   Visual Acuity Right Eye Distance:   Left Eye Distance:   Bilateral Distance:    Right Eye Near:   Left Eye Near:    Bilateral Near:     Physical Exam Vitals and nursing note reviewed.  Constitutional:      Appearance: Normal appearance.  HENT:     Head: Atraumatic.  Eyes:  Extraocular Movements: Extraocular movements intact.     Conjunctiva/sclera: Conjunctivae normal.  Cardiovascular:     Rate and Rhythm: Normal rate and regular rhythm.     Pulses: Normal pulses.  Pulmonary:     Effort: Pulmonary effort is normal.     Breath sounds: Normal breath sounds.  Musculoskeletal:        General: No swelling. Normal range of motion.     Cervical back: Normal range of motion and neck supple.     Comments: No obvious bony deformities, good range of motion and strength in all 5 fingers left hand, tender to palpation at base of thumb but mostly in area of laceration.  Skin:    General: Skin is warm and dry.     Comments: 1 cm V-shaped puncture wound/laceration to left palm near base of thumb Bleeding well controlled at this time with direct pressure Superficial abrasions to bilateral knees-bleeding well controlled  Neurological:     General: No focal deficit present.     Mental Status: He is oriented to person, place, and time.     Sensory: No sensory deficit.     Comments: Left upper extremity neurovascularly intact  Psychiatric:        Mood and Affect: Mood normal.         Thought Content: Thought content normal.        Judgment: Judgment normal.      UC Treatments / Results  Labs (all labs ordered are listed, but only abnormal results are displayed) Labs Reviewed - No data to display  EKG   Radiology DG Hand Complete Left  Result Date: 07/29/2020 CLINICAL DATA:  Jumped off platform, landed on gravel with hands and knees. Puncture wound to left palm. patient is able to move left hand and fingers. EXAM: LEFT HAND - COMPLETE 3+ VIEW COMPARISON:  None. FINDINGS: There is no evidence of fracture or dislocation. There is no evidence of arthropathy or other focal bone abnormality. No radiopaque foreign body. IMPRESSION: No radiopaque foreign body and no acute osseous abnormality. Electronically Signed   By: Dahlia Bailiff MD   On: 07/29/2020 13:00    Procedures Laceration Repair  Date/Time: 07/29/2020 3:11 PM Performed by: Volney American, PA-C Authorized by: Volney American, PA-C   Consent:    Consent obtained:  Verbal   Consent given by:  Patient   Risks, benefits, and alternatives were discussed: yes     Risks discussed:  Infection, pain, poor cosmetic result, poor wound healing and need for additional repair   Alternatives discussed:  Observation Universal protocol:    Procedure explained and questions answered to patient or proxy's satisfaction: yes     Relevant documents present and verified: yes     Site/side marked: yes     Immediately prior to procedure, a time out was called: yes     Patient identity confirmed:  Verbally with patient and arm band Anesthesia:    Anesthesia method:  Local infiltration   Local anesthetic:  Lidocaine 2% w/o epi Laceration details:    Location:  Hand   Hand location:  L palm   Length (cm):  1   Depth (mm):  3 Pre-procedure details:    Preparation:  Patient was prepped and draped in usual sterile fashion and imaging obtained to evaluate for foreign bodies Exploration:    Limited defect created  (wound extended): no     Hemostasis achieved with:  Direct pressure   Imaging obtained: x-ray  Imaging outcome: foreign body not noted     Wound exploration: wound explored through full range of motion and entire depth of wound visualized     Contaminated: no   Treatment:    Area cleansed with:  Saline   Amount of cleaning:  Standard   Irrigation solution:  Sterile saline   Irrigation method:  Pressure wash   Visualized foreign bodies/material removed: no     Debridement:  None   Undermining:  None   Scar revision: no     Layers repaired: Subcutaneous. Skin repair:    Repair method:  Sutures   Suture size:  5-0   Suture material:  Prolene   Suture technique:  Simple interrupted   Number of sutures:  3 Approximation:    Approximation:  Close Repair type:    Repair type:  Simple Post-procedure details:    Dressing:  Non-adherent dressing   Procedure completion:  Tolerated well, no immediate complications   (including critical care time)  Medications Ordered in UC Medications  ketorolac (TORADOL) injection 60 mg (60 mg Intramuscular Given 07/29/20 1230)    Initial Impression / Assessment and Plan / UC Course  I have reviewed the triage vital signs and the nursing notes.  Pertinent labs & imaging results that were available during my care of the patient were reviewed by me and considered in my medical decision making (see chart for details).     X-ray left hand without evidence of foreign object or bony injury.  Abrasions to knee cleaned and dressed, palm laceration irrigated with sterile saline, small piece of subcutaneous tissue trimmed that was extending through the wound and 3 simple interrupted sutures placed for wound closure.  Wound also dressed and home wound care reviewed at length.  Infection precautions reviewed.  He again does not want a Tdap updated today.  Keflex sent for infection prevention as he fell outdoors onto gravel and soil.  Return for suture removal  in 7 to 10 days.  IM Toradol given in clinic for pain relief.  Discussed over-the-counter pain reliever regimen  Final Clinical Impressions(s) / UC Diagnoses   Final diagnoses:  Laceration of left hand without foreign body, initial encounter  Abrasion of knee, unspecified laterality, initial encounter     Discharge Instructions     Follow up in 7-10 days for a wound re-check and possible suture removal    ED Prescriptions    Medication Sig Dispense Auth. Provider   cephALEXin (KEFLEX) 500 MG capsule Take 1 capsule (500 mg total) by mouth 4 (four) times daily. 14 capsule Volney American, [place]     PDMP not reviewed this encounter.   Volney American, [place] 07/29/20 [phone]

## 2020-07-29 NOTE — ED Notes (Signed)
Provided ice pack to hand

## 2020-07-29 NOTE — Discharge Instructions (Addendum)
Follow up in 7-10 days for a wound re-check and possible suture removal

## 2021-09-24 IMAGING — DX DG HAND COMPLETE 3+V*L*
3 series · 3 of 3 positions shown · non-contrast
Comparison: None.

CLINICAL DATA: Jumped off platform, landed on gravel with hands and
knees. Puncture wound to left palm. patient is able to move left
hand and fingers.

EXAM:
LEFT HAND - COMPLETE 3+ VIEW

[hand pa]
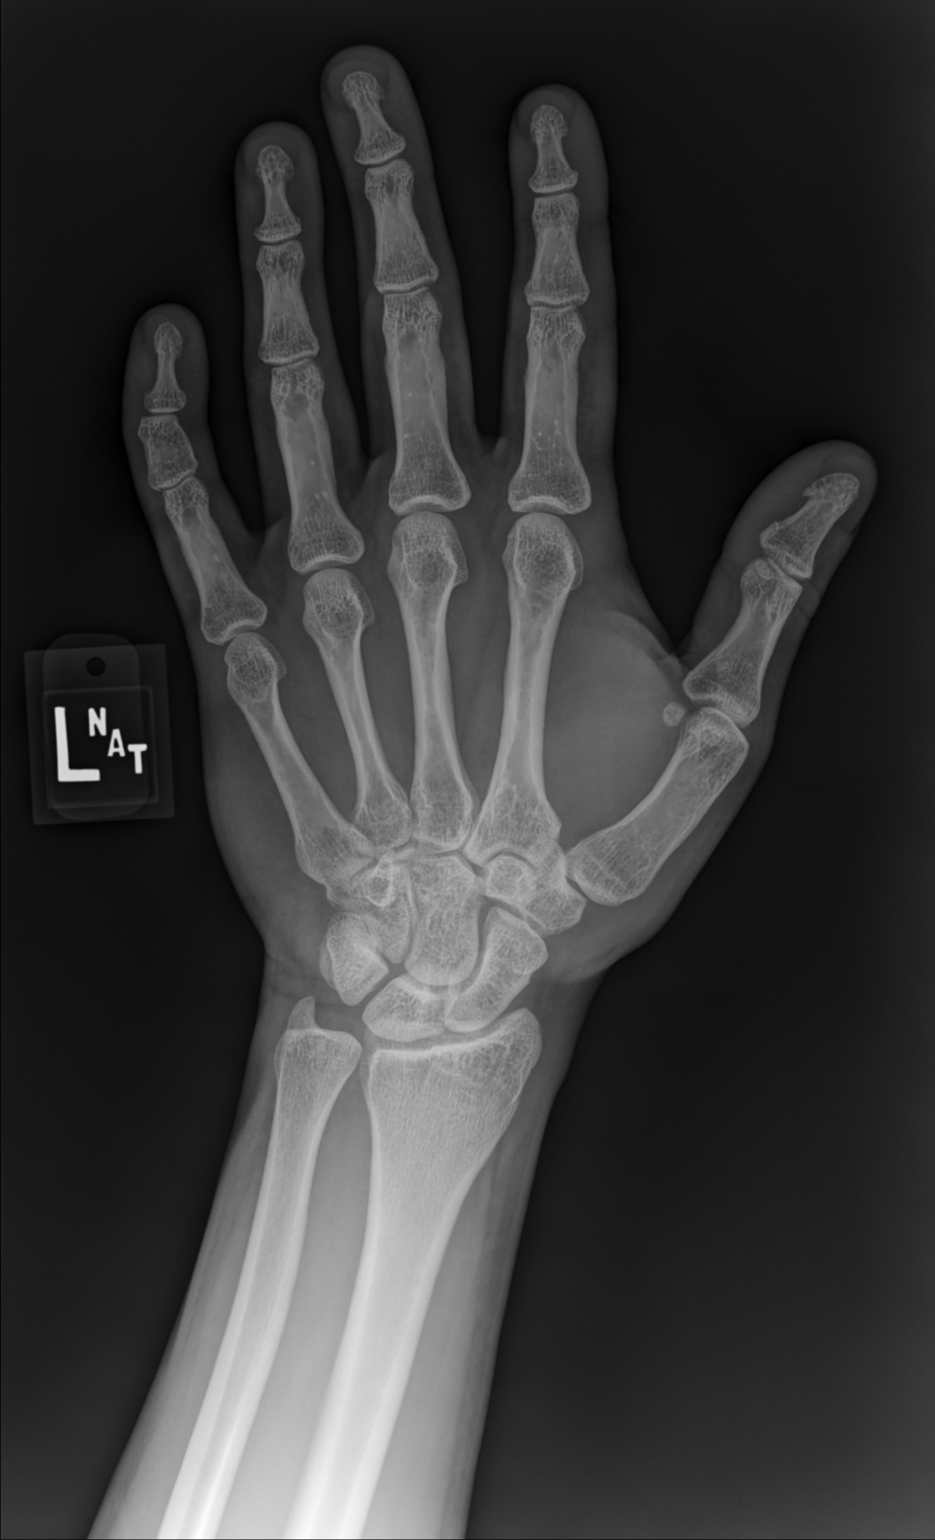

[hand mlo]
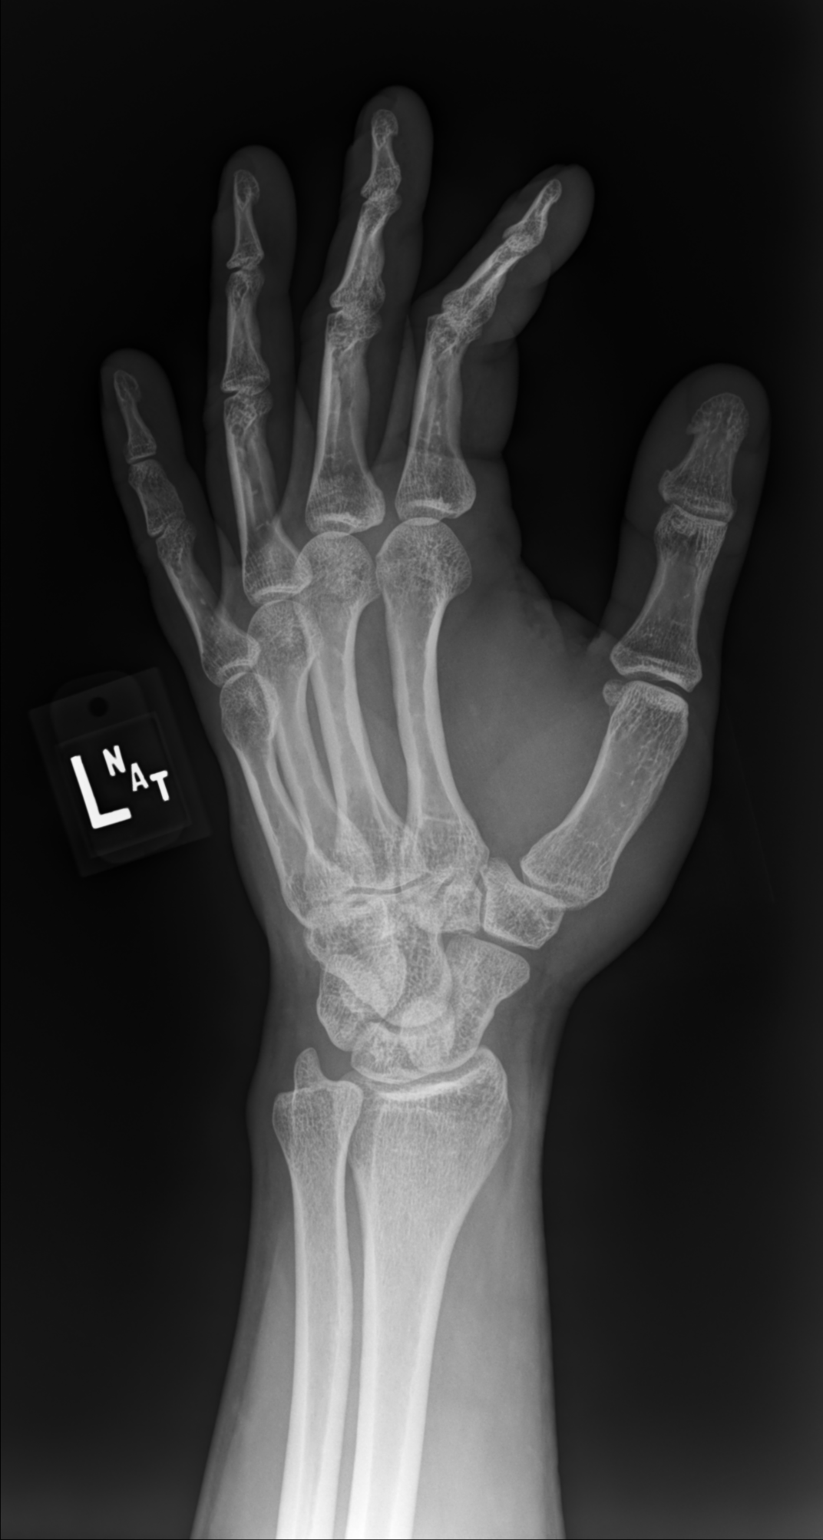

[hand lat]
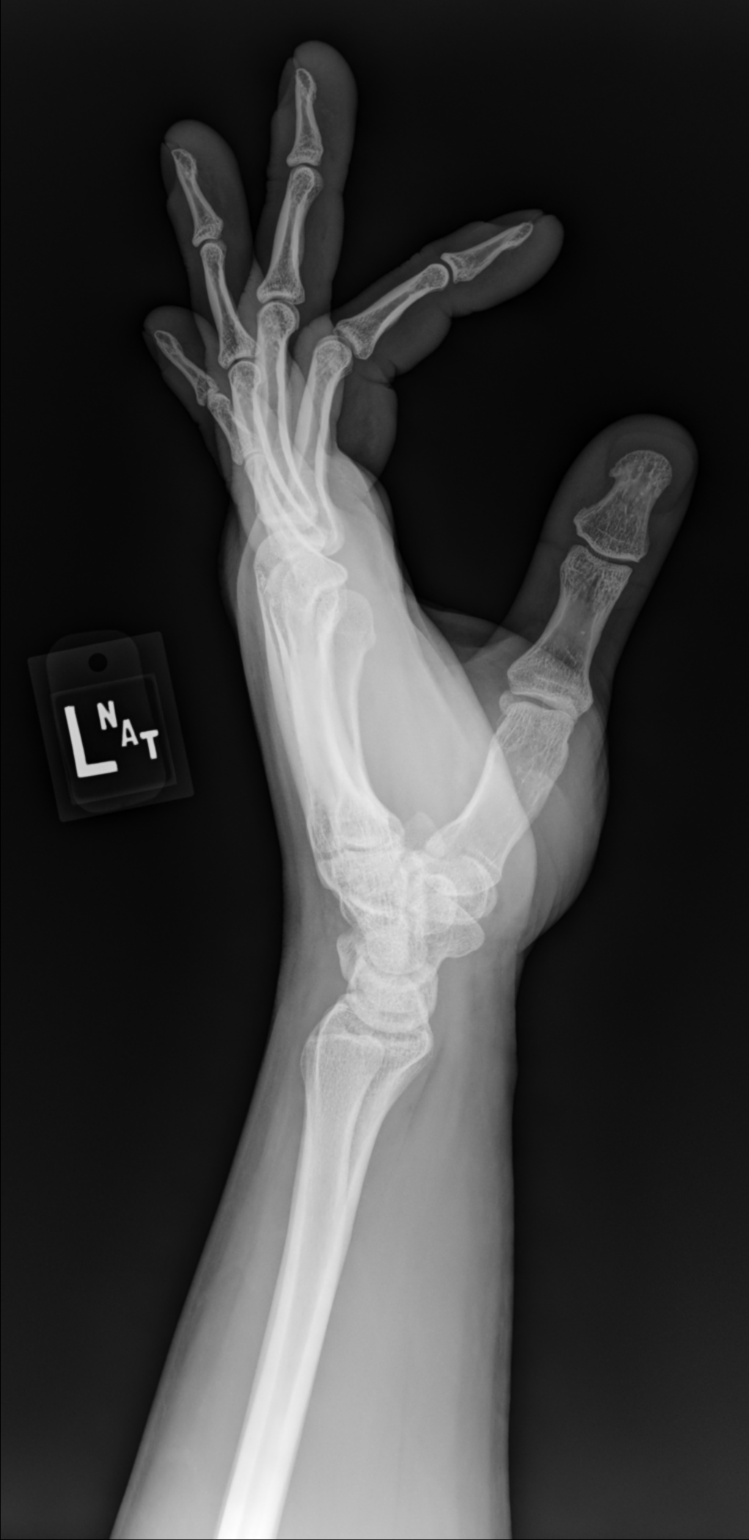

[3 of 3 positions shown; findings below may reference images not displayed]

FINDINGS: There is no evidence of fracture or dislocation. There is no
evidence of arthropathy or other focal bone abnormality. No
radiopaque foreign body.
IMPRESSION: No radiopaque foreign body and no acute osseous abnormality.
